# Patient Record
Sex: Female | Born: 1960 | Race: White | Hispanic: No | Marital: Married | Smoking: Former smoker
Health system: Southern US, Community
[De-identification: ages and names within clinical notes are randomized; demographics above are authoritative.]

## PROBLEM LIST (undated history)

## (undated) DIAGNOSIS — G43909 Migraine, unspecified, not intractable, without status migrainosus: Secondary | ICD-10-CM

## (undated) DIAGNOSIS — J302 Other seasonal allergic rhinitis: Secondary | ICD-10-CM

## (undated) DIAGNOSIS — Z87440 Personal history of urinary (tract) infections: Secondary | ICD-10-CM

## (undated) DIAGNOSIS — Z8619 Personal history of other infectious and parasitic diseases: Secondary | ICD-10-CM

## (undated) DIAGNOSIS — K219 Gastro-esophageal reflux disease without esophagitis: Secondary | ICD-10-CM

## (undated) DIAGNOSIS — E559 Vitamin D deficiency, unspecified: Secondary | ICD-10-CM

## (undated) DIAGNOSIS — R51 Headache: Secondary | ICD-10-CM

## (undated) DIAGNOSIS — K580 Irritable bowel syndrome with diarrhea: Secondary | ICD-10-CM

## (undated) HISTORY — DX: Headache: R51

## (undated) HISTORY — DX: Other seasonal allergic rhinitis: J30.2

## (undated) HISTORY — DX: Vitamin D deficiency, unspecified: E55.9

## (undated) HISTORY — DX: Irritable bowel syndrome with diarrhea: K58.0

## (undated) HISTORY — DX: Migraine, unspecified, not intractable, without status migrainosus: G43.909

## (undated) HISTORY — DX: Personal history of urinary (tract) infections: Z87.440

## (undated) HISTORY — DX: Personal history of other infectious and parasitic diseases: Z86.19

## (undated) HISTORY — PX: BREAST BIOPSY: SHX20

## (undated) HISTORY — PX: TUBAL LIGATION: SHX77

## (undated) HISTORY — DX: Gastro-esophageal reflux disease without esophagitis: K21.9

---

## 1981-01-01 HISTORY — PX: TONSILLECTOMY AND ADENOIDECTOMY: SUR1326

## 1996-01-02 HISTORY — PX: OTHER SURGICAL HISTORY: SHX169

## 1999-08-09 ENCOUNTER — Encounter: Admission: RE | Admit: 1999-08-09 | Discharge: 1999-08-09 | Payer: Self-pay | Admitting: Family Medicine

## 1999-08-09 ENCOUNTER — Encounter: Payer: Self-pay | Admitting: Family Medicine

## 1999-12-06 ENCOUNTER — Encounter: Payer: Self-pay | Admitting: Obstetrics and Gynecology

## 1999-12-06 ENCOUNTER — Encounter: Admission: RE | Admit: 1999-12-06 | Discharge: 1999-12-06 | Payer: Self-pay | Admitting: Obstetrics and Gynecology

## 2003-01-02 DIAGNOSIS — K219 Gastro-esophageal reflux disease without esophagitis: Secondary | ICD-10-CM

## 2003-01-02 DIAGNOSIS — K580 Irritable bowel syndrome with diarrhea: Secondary | ICD-10-CM

## 2003-01-02 HISTORY — DX: Gastro-esophageal reflux disease without esophagitis: K21.9

## 2003-01-02 HISTORY — DX: Irritable bowel syndrome with diarrhea: K58.0

## 2003-10-02 HISTORY — PX: ESOPHAGOGASTRODUODENOSCOPY: SHX1529

## 2003-11-23 ENCOUNTER — Ambulatory Visit: Payer: Self-pay | Admitting: Gastroenterology

## 2003-12-30 ENCOUNTER — Ambulatory Visit: Payer: Self-pay | Admitting: Gastroenterology

## 2004-01-02 HISTORY — PX: COLONOSCOPY: SHX174

## 2004-01-10 ENCOUNTER — Ambulatory Visit: Payer: Self-pay | Admitting: Gastroenterology

## 2009-07-29 LAB — COMPLETE METABOLIC PANEL WITH GFR
ALT: 14 U/L (ref 7–35)
CO2: 25 mmol/L
Chloride: 103 mmol/L
Creat: 0.86
Sodium: 142 mmol/L (ref 137–147)

## 2009-07-29 LAB — LIPID PANEL
Cholesterol, Total: 159
Direct LDL: 91
HDL: 60 mg/dL (ref 35–70)
Triglyceride fasting, serum: 39

## 2009-11-01 HISTORY — PX: HAMMER TOE SURGERY: SHX385

## 2010-08-16 ENCOUNTER — Ambulatory Visit (INDEPENDENT_AMBULATORY_CARE_PROVIDER_SITE_OTHER): Payer: PRIVATE HEALTH INSURANCE | Admitting: Family Medicine

## 2010-08-16 ENCOUNTER — Encounter: Payer: Self-pay | Admitting: Family Medicine

## 2010-08-16 VITALS — BP 104/78 | HR 76 | Temp 98.9°F | Ht 64.0 in | Wt 141.2 lb

## 2010-08-16 DIAGNOSIS — N390 Urinary tract infection, site not specified: Secondary | ICD-10-CM | POA: Insufficient documentation

## 2010-08-16 DIAGNOSIS — J309 Allergic rhinitis, unspecified: Secondary | ICD-10-CM

## 2010-08-16 DIAGNOSIS — R5381 Other malaise: Secondary | ICD-10-CM

## 2010-08-16 DIAGNOSIS — Z0000 Encounter for general adult medical examination without abnormal findings: Secondary | ICD-10-CM | POA: Insufficient documentation

## 2010-08-16 DIAGNOSIS — J302 Other seasonal allergic rhinitis: Secondary | ICD-10-CM | POA: Insufficient documentation

## 2010-08-16 DIAGNOSIS — R5383 Other fatigue: Secondary | ICD-10-CM

## 2010-08-16 DIAGNOSIS — Z87440 Personal history of urinary (tract) infections: Secondary | ICD-10-CM

## 2010-08-16 LAB — CBC WITH DIFFERENTIAL/PLATELET
Basophils Absolute: 0 10*3/uL (ref 0.0–0.1)
Basophils Relative: 0.7 % (ref 0.0–3.0)
Eosinophils Absolute: 0.1 10*3/uL (ref 0.0–0.7)
Eosinophils Relative: 1.9 % (ref 0.0–5.0)
HCT: 38 % (ref 36.0–46.0)
Hemoglobin: 12.7 g/dL (ref 12.0–15.0)
Lymphocytes Relative: 27.4 % (ref 12.0–46.0)
Lymphs Abs: 1.6 10*3/uL (ref 0.7–4.0)
MCHC: 33.4 g/dL (ref 30.0–36.0)
MCV: 89.1 fl (ref 78.0–100.0)
Monocytes Absolute: 0.3 10*3/uL (ref 0.1–1.0)
Monocytes Relative: 6.1 % (ref 3.0–12.0)
Neutro Abs: 3.6 10*3/uL (ref 1.4–7.7)
Neutrophils Relative %: 63.9 % (ref 43.0–77.0)
Platelets: 305 10*3/uL (ref 150.0–400.0)
RBC: 4.26 Mil/uL (ref 3.87–5.11)
RDW: 13.6 % (ref 11.5–14.6)
WBC: 5.7 10*3/uL (ref 4.5–10.5)

## 2010-08-16 LAB — LIPID PANEL
Cholesterol: 171 mg/dL (ref 0–200)
HDL: 66.6 mg/dL (ref >39.00)
LDL Cholesterol: 95 mg/dL (ref 0–99)
Total CHOL/HDL Ratio: 3
Triglycerides: 46 mg/dL (ref 0.0–149.0)
VLDL: 9.2 mg/dL (ref 0.0–40.0)

## 2010-08-16 LAB — COMPREHENSIVE METABOLIC PANEL
ALT: 24 U/L (ref 0–35)
AST: 24 U/L (ref 0–37)
Albumin: 4.4 g/dL (ref 3.5–5.2)
Alkaline Phosphatase: 58 U/L (ref 39–117)
BUN: 10 mg/dL (ref 6–23)
CO2: 28 mEq/L (ref 19–32)
Calcium: 9.3 mg/dL (ref 8.4–10.5)
Chloride: 106 mEq/L (ref 96–112)
Creatinine, Ser: 0.7 mg/dL (ref 0.4–1.2)
GFR: 93.3 mL/min (ref >60.00)
Glucose, Bld: 94 mg/dL (ref 70–99)
Potassium: 4.1 mEq/L (ref 3.5–5.1)
Sodium: 142 mEq/L (ref 135–145)
Total Bilirubin: 0.5 mg/dL (ref 0.3–1.2)
Total Protein: 7.7 g/dL (ref 6.0–8.3)

## 2010-08-16 LAB — VITAMIN B12: Vitamin B-12: 362 pg/mL (ref 211–911)

## 2010-08-16 LAB — TSH: TSH: 1.3 u[IU]/mL (ref 0.35–5.50)

## 2010-08-16 MED ORDER — DROSPIRENONE-ESTRADIOL 0.5-1 MG PO TABS: 1.0000 | ORAL_TABLET | Freq: Every day | ORAL | Status: DC

## 2010-08-16 NOTE — Progress Notes (Signed)
Subjective:    Patient ID: Kathy Pugh, female    DOB: November 22, 1960, 50 y.o.   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  Would like to establish with PCP.    Uses trimethoprim 50mg  for UTI ppx after intercourse.  Saw [place] for this in past.  End of June - ear pain/muffled, ST, nausea, cough - dx with sinusitis.  Saw Tomi Bamberger, treated with oxycodone for pain which made her nauseated and amox 1000mg  for 10 day course which caused diarrhea.  All of that better, but remaining fatigued, swollen glands in throat at end of day or when going outside to walk.  Previously liked to walk 3-4 miles daily, but since illness, too tired to walk.  Does think did have recurrence of fatigue/tired at end of July, prior to that was recuperating well.  Sleeps well.  Wakes up feeling rested.  Taking benadryl at night.  Sometimes feels flegm in throat.  No other allergy sxs such as PNDrip, RN, congestion, sneezing, itchy/watery eyes.  Preventative: Unsure last tetanus - may receive next visit. Mammogram - has been going to [place], would like to change and wants to go to breast center.  03/2010 was last one.  Had biopsy done L breast 2008, unsure. Usually receives pap smear in July, due for this.  Would like to start receiving well woman here. No recent blood work. Colonoscopy by Dr. Jarold Motto unsure when but rec rpt 10 years.  I will look at records.  Told to stay away from NSAIDs. Brings records from [employer].  Medications and allergies reviewed and updated in chart. There is no problem list on file for this patient.  Past Medical History  Diagnosis Date  . History of chicken pox   . Headache     frequent, takes tylenol  . Seasonal allergies   . Migraines     none in years  . Hx: UTI (urinary tract infection)    Past Surgical History  Procedure Date  . Tonsillectomy and adenoidectomy 1983  . Tubal ligation   . Hammer toe surgery 11/2009    left  foot - hammer toe and bunion   History  Substance Use Topics  . Smoking status: Former Smoker    Quit date: 03/02/1983  . Smokeless tobacco: Never Used  . Alcohol Use: No   Family History  Problem Relation Age of Onset  . Hyperlipidemia Mother   . Diabetes Father   . Coronary artery disease Father 21  . Hypertension Sister   . Stroke Maternal Grandfather   . Cancer Neg Hx    Allergies  Allergen Reactions  . Oxycodone Nausea Only   No current outpatient prescriptions on file prior to visit.   Review of Systems  Constitutional: Negative for fever, chills, activity change, appetite change, fatigue and unexpected weight change.  HENT: Negative for hearing loss and neck pain.   Eyes: Negative for visual disturbance.  Respiratory: Positive for cough. Negative for chest tightness, shortness of breath and wheezing.   Cardiovascular: Negative for chest pain, palpitations and leg swelling.  Gastrointestinal: Negative for nausea, vomiting, abdominal pain, diarrhea, constipation, blood in stool and abdominal distention.  Genitourinary: Negative for hematuria and difficulty urinating.  Musculoskeletal: Negative for myalgias and arthralgias.  Skin: Negative for rash.  Neurological: Negative for dizziness, seizures, syncope and headaches.  Hematological: Does not bruise/bleed easily.  Psychiatric/Behavioral: Negative for dysphoric mood. The patient is not nervous/anxious.  Objective:   Physical Exam  Nursing note and vitals reviewed. Constitutional: She is oriented to person, place, and time. She appears well-developed and well-nourished. No distress.  HENT:  Head: Normocephalic and atraumatic.  Right Ear: Hearing, tympanic membrane, external ear and ear canal normal.  Left Ear: Hearing, tympanic membrane, external ear and ear canal normal.  Nose: Nose normal. No mucosal edema or rhinorrhea.  Mouth/Throat: Uvula is midline, oropharynx is clear and moist and mucous membranes  are normal. No oropharyngeal exudate, posterior oropharyngeal edema, posterior oropharyngeal erythema or tonsillar abscesses.  Eyes: Conjunctivae and EOM are normal. Pupils are equal, round, and reactive to light.  Neck: Normal range of motion. Neck supple. No thyromegaly present.  Cardiovascular: Normal rate, regular rhythm, normal heart sounds and intact distal pulses.   No murmur heard. Pulses:      Radial pulses are 2+ on the right side, and 2+ on the left side.  Pulmonary/Chest: Effort normal and breath sounds normal. No respiratory distress. She has no wheezes. She has no rales.  Abdominal: Soft. Bowel sounds are normal. She exhibits no distension and no mass. There is no tenderness. There is no rebound and no guarding.  Musculoskeletal: Normal range of motion.  Lymphadenopathy:       Head (right side): No submental, no submandibular, no tonsillar, no preauricular, no posterior auricular and no occipital adenopathy present.       Head (left side): No submental, no submandibular, no tonsillar, no preauricular, no posterior auricular and no occipital adenopathy present.    She has cervical adenopathy.       Right cervical: Superficial cervical adenopathy present. No posterior cervical adenopathy present.      Left cervical: Superficial cervical adenopathy present. No posterior cervical adenopathy present.    She has no axillary adenopathy.       Right: No supraclavicular adenopathy present.       Left: No supraclavicular adenopathy present.  Neurological: She is alert and oriented to person, place, and time.       CN grossly intact, station and gait intact  Skin: Skin is warm and dry. No rash noted.  Psychiatric: She has a normal mood and affect. Her behavior is normal. Judgment and thought content normal.          Assessment & Plan:

## 2010-08-16 NOTE — Patient Instructions (Addendum)
Blood work today to check on things. I will review records and then call you to pick up when done. Ensure getting plenty of water throughout the day, consider starting multivitamin. Consider starting zyrtec for allergies. Try to get some walking in daily even if not feeling up to it. Return at your convenience in next 3-4 wks for physical.

## 2010-08-16 NOTE — Assessment & Plan Note (Addendum)
After sinusitis late 06/2010, treated with amoxicillin. Check basic blood work to eval fatigue. Anticipate LAD from recent sinusitis.  If remaining into next visit, consider surg referral vs abx treatment. Alternatively could be seasonal allergies given phlegm/cough, recommended trial of zyrtec or other 2nd gen antihistamine. Return for CPE/pap.

## 2010-08-20 ENCOUNTER — Encounter: Payer: Self-pay | Admitting: Family Medicine

## 2010-08-20 ENCOUNTER — Telehealth: Payer: Self-pay | Admitting: Family Medicine

## 2010-08-20 DIAGNOSIS — Z0000 Encounter for general adult medical examination without abnormal findings: Secondary | ICD-10-CM

## 2010-08-20 DIAGNOSIS — E559 Vitamin D deficiency, unspecified: Secondary | ICD-10-CM | POA: Insufficient documentation

## 2010-08-20 NOTE — Telephone Encounter (Signed)
Please notify I reviewed records and ready to pick up. Please scan and input records selected.

## 2010-08-21 ENCOUNTER — Encounter: Payer: Self-pay | Admitting: Family Medicine

## 2010-08-21 NOTE — Telephone Encounter (Signed)
Message left notifying patient that records were ready for pick up. Placed them up front. Also sent records for scanning and input results.

## 2010-08-24 ENCOUNTER — Encounter: Payer: Self-pay | Admitting: Family Medicine

## 2010-08-28 ENCOUNTER — Other Ambulatory Visit: Payer: Self-pay | Admitting: *Deleted

## 2010-08-28 MED ORDER — DROSPIRENONE-ESTRADIOL 0.5-1 MG PO TABS: 1.0000 | ORAL_TABLET | Freq: Every day | ORAL | Status: DC

## 2010-09-06 ENCOUNTER — Encounter: Payer: PRIVATE HEALTH INSURANCE | Admitting: Family Medicine

## 2010-09-22 ENCOUNTER — Encounter: Payer: PRIVATE HEALTH INSURANCE | Admitting: Family Medicine

## 2010-11-10 ENCOUNTER — Ambulatory Visit (INDEPENDENT_AMBULATORY_CARE_PROVIDER_SITE_OTHER): Payer: PRIVATE HEALTH INSURANCE | Admitting: Family Medicine

## 2010-11-10 ENCOUNTER — Encounter: Payer: Self-pay | Admitting: Family Medicine

## 2010-11-10 VITALS — BP 114/70 | HR 80 | Temp 98.7°F | Ht 63.5 in | Wt 142.8 lb

## 2010-11-10 DIAGNOSIS — Z0000 Encounter for general adult medical examination without abnormal findings: Secondary | ICD-10-CM

## 2010-11-10 DIAGNOSIS — R5381 Other malaise: Secondary | ICD-10-CM

## 2010-11-10 DIAGNOSIS — Z1211 Encounter for screening for malignant neoplasm of colon: Secondary | ICD-10-CM

## 2010-11-10 DIAGNOSIS — R5383 Other fatigue: Secondary | ICD-10-CM

## 2010-11-10 NOTE — Progress Notes (Signed)
Subjective:    Patient ID: Kathy Pugh, female    DOB: 1960/07/28, 50 y.o.   MRN: [id number]  HPI CC: CPE today  No concerns.  Zyrtec working well.  Preventative:  Unsure last tetanus - declines today. Had flu shot 10/18/2010 at work. Mammogram - has been going to [place], would like to change and wants to go to breast center.  03/2010 was last one.  Had biopsy done L breast 2008, unsure. Usually receives pap smear in July, due for this.  Last received 07/2009.  Mom with ?h/o uterine cancer, abnormal cells that led to hysterectomy.  Has had normal yearly pap smears for last 3 years, actually lifelong normal.  May space out to q2 years, rpt due fall 2013. Colonoscopy by Dr. Jarold Motto unsure when but rec rpt 10 years. I will look at records. Told to stay away from NSAIDs.  Had EGD 2005 in system, thinks done at same time.  Will start with iFOB today. Asks about bone density.  Loves milk, good dairy in diet.  Medications and allergies reviewed and updated in chart.  Past histories reviewed and updated if relevant as below. Patient Active Problem List  Diagnoses  . Seasonal allergies  . Hx: UTI (urinary tract infection)  . Healthcare maintenance  . Fatigue  . Vitamin D deficiency   Past Medical History  Diagnosis Date  . History of chicken pox   . Headache     frequent, takes tylenol  . Seasonal allergies   . Migraines     none in years  . Hx: UTI (urinary tract infection)     trimethoprim ppx  . Irritable bowel syndrome with diarrhea 2005    per GI, Dr. Jarold Motto   Past Surgical History  Procedure Date  . Tonsillectomy and adenoidectomy 1983  . Tubal ligation   . Hammer toe surgery 11/2009    left foot - hammer toe and bunion  . Laparoscopic uterine nerve ablation 1998    for endometriosis  . Esophagogastroduodenoscopy 10/2003    GERD, HH, esoph stricture, neg H pylori   History  Substance Use Topics  . Smoking status: Former Smoker    Quit date: 03/02/1983  .  Smokeless tobacco: Never Used  . Alcohol Use: No   Family History  Problem Relation Age of Onset  . Hyperlipidemia Mother   . Diabetes Father   . Coronary artery disease Father 50  . Hypertension Sister   . Stroke Maternal Grandfather   . Cancer Neg Hx     ? mother with uterine   Allergies  Allergen Reactions  . Vicodin (Hydrocodone-Acetaminophen) Nausea Only   Current Outpatient Prescriptions on File Prior to Visit  Medication Sig Dispense Refill  . drospirenone-estradiol (ANGELIQ) 0.5-1 MG per tablet Take 1 tablet by mouth daily.  30 tablet  0  . trimethoprim (TRIMPEX) 100 MG tablet Take 50 mg by mouth as directed.        . vitamin C (ASCORBIC ACID) 500 MG tablet Take 500 mg by mouth daily.         Review of Systems  Constitutional: Negative for fever, chills, activity change, appetite change, fatigue and unexpected weight change.  HENT: Negative for hearing loss and neck pain.   Eyes: Negative for visual disturbance.  Respiratory: Positive for cough. Negative for chest tightness, shortness of breath and wheezing.   Cardiovascular: Negative for chest pain, palpitations and leg swelling.  Gastrointestinal: Negative for nausea, vomiting, abdominal pain, diarrhea, constipation, blood in stool and  abdominal distention.  Genitourinary: Negative for hematuria and difficulty urinating.  Musculoskeletal: Negative for myalgias and arthralgias.  Skin: Negative for rash.  Neurological: Negative for dizziness, seizures, syncope and headaches.  Hematological: Does not bruise/bleed easily.  Psychiatric/Behavioral: Negative for dysphoric mood. The patient is not nervous/anxious.        Objective:   Physical Exam  Nursing note and vitals reviewed. Constitutional: She is oriented to person, place, and time. She appears well-developed and well-nourished. No distress.  HENT:  Head: Normocephalic and atraumatic.  Right Ear: Hearing, tympanic membrane, external ear and ear canal normal.    Left Ear: Hearing, tympanic membrane, external ear and ear canal normal.  Nose: Nose normal. No mucosal edema or rhinorrhea.  Mouth/Throat: Uvula is midline, oropharynx is clear and moist and mucous membranes are normal. No oropharyngeal exudate, posterior oropharyngeal edema, posterior oropharyngeal erythema or tonsillar abscesses.  Eyes: Conjunctivae and EOM are normal. Pupils are equal, round, and reactive to light. No scleral icterus.  Neck: Normal range of motion. Neck supple. No thyromegaly present.  Cardiovascular: Normal rate, regular rhythm, normal heart sounds and intact distal pulses.   No murmur heard. Pulses:      Radial pulses are 2+ on the right side, and 2+ on the left side.  Pulmonary/Chest: Effort normal and breath sounds normal. No respiratory distress. She has no wheezes. She has no rales. Right breast exhibits no inverted nipple, no mass, no nipple discharge, no skin change and no tenderness. Left breast exhibits no inverted nipple, no mass, no nipple discharge, no skin change and no tenderness. Breasts are symmetrical.  Abdominal: Soft. Bowel sounds are normal. She exhibits no distension and no mass. There is no tenderness. There is no rebound and no guarding.  Musculoskeletal: Normal range of motion.  Lymphadenopathy:    She has no cervical adenopathy.  Neurological: She is alert and oriented to person, place, and time.       CN grossly intact, station and gait intact  Skin: Skin is warm and dry. No rash noted.  Psychiatric: She has a normal mood and affect. Her behavior is normal. Judgment and thought content normal.      Assessment & Plan:

## 2010-11-10 NOTE — Assessment & Plan Note (Signed)
Improved

## 2010-11-10 NOTE — Assessment & Plan Note (Addendum)
Reviewed preventative protocols, updated unless pt declined. Declines tetanus. Flu UTD this year. Breast exam today.  Due for mammo 03/2010.  Would like at breast center. Deferred pap today given all normal hx in past.  Will space out to Q2 years given ?fm hx, rpt 2013, screen q2 yrs. Unable to find records of colonoscopy but pt states had done 2005 and told normal, rpt due 10 yrs - will start with iFOB and try to obtain copy of colonoscopy.

## 2010-11-10 NOTE — Patient Instructions (Signed)
Physical today - all normal. Mammogram due 03/2010.  Let us know if you need referral to breast center. Think about tetanus shot.  Try to get most or all of your calcium from your food--aim for 1200 mg/day for women over 50 and men over 70.  To figure out dietary calcium: 300 mg/day from all non dairy foods plus 300 mg per cup of milk, other dairy, or fortified juice. Non dairy foods that contain calcium: Kale, oranges, sardines, oatmeal, soy milk/soybeans, salmon, white beans, dried figs, turnip greens, almonds, broccoli, tofu.  Vitamin D goal is 800 IU daily.  Dietary sources: fish - mackerel, tuna, salmon, herring, sardines, catfish, cod liver oil, and eggs.  And small doses of sunshine throughout the day.  Return in 1 year for next physical, sooner if needed.

## 2010-11-21 ENCOUNTER — Other Ambulatory Visit: Payer: Self-pay | Admitting: Dermatology

## 2010-11-24 ENCOUNTER — Other Ambulatory Visit: Payer: Self-pay | Admitting: Family Medicine

## 2010-11-24 ENCOUNTER — Other Ambulatory Visit: Payer: PRIVATE HEALTH INSURANCE

## 2010-11-24 DIAGNOSIS — Z1211 Encounter for screening for malignant neoplasm of colon: Secondary | ICD-10-CM

## 2010-11-27 ENCOUNTER — Encounter: Payer: Self-pay | Admitting: *Deleted

## 2011-01-17 ENCOUNTER — Other Ambulatory Visit: Payer: Self-pay | Admitting: Internal Medicine

## 2011-01-17 MED ORDER — DROSPIRENONE-ESTRADIOL 0.5-1 MG PO TABS: 1.0000 | ORAL_TABLET | Freq: Every day | ORAL | Status: DC

## 2011-01-17 NOTE — Telephone Encounter (Signed)
Rx faxed to pharmacy  

## 2011-02-19 ENCOUNTER — Other Ambulatory Visit: Payer: Self-pay | Admitting: Family Medicine

## 2011-02-19 DIAGNOSIS — Z1231 Encounter for screening mammogram for malignant neoplasm of breast: Secondary | ICD-10-CM

## 2011-03-15 ENCOUNTER — Ambulatory Visit
Admission: RE | Admit: 2011-03-15 | Discharge: 2011-03-15 | Disposition: A | Discharge status: Home / Self Care | Payer: Commercial Managed Care - PPO | Source: Ambulatory Visit | Attending: Family Medicine | Admitting: Family Medicine

## 2011-03-15 DIAGNOSIS — Z1231 Encounter for screening mammogram for malignant neoplasm of breast: Secondary | ICD-10-CM

## 2011-03-23 ENCOUNTER — Encounter: Payer: Self-pay | Admitting: *Deleted

## 2011-03-28 ENCOUNTER — Ambulatory Visit (INDEPENDENT_AMBULATORY_CARE_PROVIDER_SITE_OTHER): Payer: Commercial Managed Care - PPO | Admitting: Family Medicine

## 2011-03-28 ENCOUNTER — Encounter: Payer: Self-pay | Admitting: Family Medicine

## 2011-03-28 VITALS — BP 110/60 | HR 88 | Temp 99.9°F | Wt 142.0 lb

## 2011-03-28 DIAGNOSIS — J069 Acute upper respiratory infection, unspecified: Secondary | ICD-10-CM | POA: Insufficient documentation

## 2011-03-28 MED ORDER — HYDROCOD POLST-CHLORPHEN POLST 10-8 MG/5ML PO LQCR: 5.0000 mL | Freq: Every evening | ORAL | Status: DC | PRN

## 2011-03-28 MED ORDER — AMOXICILLIN-POT CLAVULANATE 875-125 MG PO TABS: 1.0000 | ORAL_TABLET | Freq: Two times a day (BID) | ORAL | Status: AC

## 2011-03-28 NOTE — Patient Instructions (Signed)
Take Augmentin as directed- 1 tab twice daily for 10 days.  Drink lots of fluids.  Treat sympotmatically with Zyrtec,  Mucinex, nasal saline irrigation, and Tylenol/Ibuprofen. Cough suppressant at night. Call if not improving as expected in 5-7 days.

## 2011-03-28 NOTE — Progress Notes (Signed)
SUBJECTIVE:  Kathy Pugh is a 51 y.o. female who complains of coryza, congestion, sneezing, sore throat, dry cough, myalgias, headache and bilateral sinus pain for 10 days. She denies a history of anorexia, chest pain, fevers, shortness of breath, sweats, vomiting and weakness and denies a history of asthma. Patient denies smoke cigarettes.   Patient Active Problem List  Diagnoses  . Seasonal allergies  . Hx: UTI (urinary tract infection)  . Healthcare maintenance  . Fatigue  . Vitamin D deficiency  . URI (upper respiratory infection)   Past Medical History  Diagnosis Date  . History of chicken pox   . Headache     frequent, takes tylenol  . Seasonal allergies   . Migraines     none in years  . Hx: UTI (urinary tract infection)     trimethoprim ppx  . Irritable bowel syndrome with diarrhea 2005    per GI, Dr. Jarold Motto   Past Surgical History  Procedure Date  . Tonsillectomy and adenoidectomy 1983  . Tubal ligation   . Hammer toe surgery 11/2009    left foot - hammer toe and bunion  . Laparoscopic uterine nerve ablation 1998    for endometriosis  . Esophagogastroduodenoscopy 10/2003    GERD, HH, esoph stricture, neg H pylori   History  Substance Use Topics  . Smoking status: Former Smoker    Quit date: 03/02/1983  . Smokeless tobacco: Never Used  . Alcohol Use: No   Family History  Problem Relation Age of Onset  . Hyperlipidemia Mother   . Diabetes Father   . Coronary artery disease Father 1  . Hypertension Sister   . Stroke Maternal Grandfather   . Cancer Neg Hx     ? mother with uterine   Allergies  Allergen Reactions  . Vicodin (Hydrocodone-Acetaminophen) Nausea Only   Current Outpatient Prescriptions on File Prior to Visit  Medication Sig Dispense Refill  . cetirizine (ZYRTEC) 10 MG tablet Take 10 mg by mouth daily.        . drospirenone-estradiol (ANGELIQ) 0.5-1 MG per tablet Take 1 tablet by mouth daily.  30 tablet  3  . trimethoprim (TRIMPEX)  100 MG tablet Take 50 mg by mouth as directed.        . vitamin C (ASCORBIC ACID) 500 MG tablet Take 500 mg by mouth daily.         The PMH, PSH, Social History, Family History, Medications, and allergies have been reviewed in [facility], and have been updated if relevant.  OBJECTIVE: She appears well, vital signs are as noted. Ears normal.  Throat and pharynx normal.  Neck supple. No adenopathy in the neck. Nose is congested. Sinuses  Tender to palp throughout. The chest is clear, without wheezes or rales.  ASSESSMENT:  Sinusitis   PLAN: Given duration and progression of symptoms, will treat for bacterial sinusitis with Augmentin. Symptomatic therapy suggested: push fluids, rest and return office visit prn if symptoms persist or worsen.  Call or return to clinic prn if these symptoms worsen or fail to improve as anticipated.

## 2011-05-06 ENCOUNTER — Other Ambulatory Visit: Payer: Self-pay | Admitting: Family Medicine

## 2011-06-10 ENCOUNTER — Other Ambulatory Visit: Payer: Self-pay | Admitting: Family Medicine

## 2011-11-07 ENCOUNTER — Other Ambulatory Visit: Payer: Self-pay | Admitting: Family Medicine

## 2011-11-07 DIAGNOSIS — E559 Vitamin D deficiency, unspecified: Secondary | ICD-10-CM

## 2011-11-07 DIAGNOSIS — Z0000 Encounter for general adult medical examination without abnormal findings: Secondary | ICD-10-CM

## 2011-11-09 ENCOUNTER — Other Ambulatory Visit (INDEPENDENT_AMBULATORY_CARE_PROVIDER_SITE_OTHER): Payer: Commercial Managed Care - PPO

## 2011-11-09 DIAGNOSIS — Z0000 Encounter for general adult medical examination without abnormal findings: Secondary | ICD-10-CM

## 2011-11-09 DIAGNOSIS — E559 Vitamin D deficiency, unspecified: Secondary | ICD-10-CM

## 2011-11-09 LAB — BASIC METABOLIC PANEL
CO2: 27 mEq/L (ref 19–32)
Chloride: 108 mEq/L (ref 96–112)
Potassium: 4 mEq/L (ref 3.5–5.1)
Sodium: 141 mEq/L (ref 135–145)

## 2011-11-22 ENCOUNTER — Other Ambulatory Visit: Payer: Self-pay | Admitting: Family Medicine

## 2011-11-26 ENCOUNTER — Other Ambulatory Visit (HOSPITAL_COMMUNITY)
Admission: RE | Admit: 2011-11-26 | Discharge: 2011-11-26 | Disposition: A | Discharge status: Home / Self Care | Payer: Commercial Managed Care - PPO | Source: Ambulatory Visit | Attending: Family Medicine | Admitting: Family Medicine

## 2011-11-26 ENCOUNTER — Encounter: Payer: Self-pay | Admitting: Family Medicine

## 2011-11-26 ENCOUNTER — Ambulatory Visit (INDEPENDENT_AMBULATORY_CARE_PROVIDER_SITE_OTHER): Payer: Commercial Managed Care - PPO | Admitting: Family Medicine

## 2011-11-26 VITALS — BP 104/64 | HR 72 | Temp 98.5°F | Ht 63.5 in | Wt 144.2 lb

## 2011-11-26 DIAGNOSIS — Z1151 Encounter for screening for human papillomavirus (HPV): Secondary | ICD-10-CM | POA: Insufficient documentation

## 2011-11-26 DIAGNOSIS — Z0000 Encounter for general adult medical examination without abnormal findings: Secondary | ICD-10-CM

## 2011-11-26 DIAGNOSIS — Z124 Encounter for screening for malignant neoplasm of cervix: Secondary | ICD-10-CM

## 2011-11-26 DIAGNOSIS — Z01419 Encounter for gynecological examination (general) (routine) without abnormal findings: Secondary | ICD-10-CM | POA: Insufficient documentation

## 2011-11-26 DIAGNOSIS — E559 Vitamin D deficiency, unspecified: Secondary | ICD-10-CM

## 2011-11-26 NOTE — Patient Instructions (Addendum)
Breast exam today and pelvic exam today. Good to see you, call us with questions. Start vitamin D 1000 international units daily to help raise levels. We will continue Angeliq for now. We will try to get records of colonoscopy. Have a happy thanksgiving. Tetanus shot?

## 2011-11-26 NOTE — Assessment & Plan Note (Signed)
Preventative protocols reviewed and updated unless pt declined. Discussed healthy diet and lifestyle. Pap/breast exam performed today. Reviewed blood work in detail.

## 2011-11-26 NOTE — Assessment & Plan Note (Signed)
Discussed dietary vit D sources and recommended starting 1000 IU dialy.

## 2011-11-26 NOTE — Progress Notes (Signed)
Subjective:    Patient ID: Kathy Pugh, female    DOB: [birth year], 51 y.o.   MRN: [id number]  HPI CC: CPE  HRT - Angeliq started around menopause at age 67 yo.  Started for vag dryness as well as hot flashes and significantly helped.  Taking shagging lessons with husband.  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  Good fruits and vegetables, lots of water.  Yogurt for breakfast, 1 cup milk/day.  Sunscreen use discussed Seat belt use discussed  Preventative:  Unsure last tetanus - declines today.  Not around children younger than one year of age. UTD flu shot. Mammogram - birads 1 03/2011.  Gets regularly at breast center.  Had biopsy done L breast 2008, unsure.  Well woman exam - pap smear due this year.  Last received 07/2009, always normal. Mom with ?h/o uterine cancer, abnormal cells that led to hysterectomy. given fm hx will space out to q2 years, rpt due this year.   Colonoscopy by Dr. Jarold Motto.  Had EGD 2005 in system, thinks done at same time. No records of this.  Requests iFOB  Medications and allergies reviewed and updated in chart.  Past histories reviewed and updated if relevant as below. Patient Active Problem List  Diagnosis  . Seasonal allergies  . Hx: UTI (urinary tract infection)  . Healthcare maintenance  . Fatigue  . Vitamin D deficiency  . URI (upper respiratory infection)   Past Medical History  Diagnosis Date  . History of chicken pox   . Headache     frequent, takes tylenol  . Seasonal allergies   . Migraines     none in years  . Hx: UTI (urinary tract infection)     trimethoprim ppx  . Irritable bowel syndrome with diarrhea 2005    per GI, Dr. Jarold Motto  . GERD (gastroesophageal reflux disease) 2005    with esophageal stricture and HH per EGD   Past Surgical History  Procedure Date  .  Tonsillectomy and adenoidectomy 1983  . Tubal ligation   . Hammer toe surgery 11/2009    left foot - hammer toe and bunion  . Laparoscopic uterine nerve ablation 1998    for endometriosis  . Esophagogastroduodenoscopy 10/2003    GERD, HH, esoph stricture, neg H pylori   History  Substance Use Topics  . Smoking status: Former Smoker    Quit date: 03/02/1983  . Smokeless tobacco: Never Used  . Alcohol Use: No   Family History  Problem Relation Age of Onset  . Hyperlipidemia Mother   . Diabetes Father   . Coronary artery disease Father 79  . Hypertension Sister   . Stroke Maternal Grandfather   . Cancer Neg Hx     ? mother with uterine   Allergies  Allergen Reactions  . Vicodin (Hydrocodone-Acetaminophen) Nausea Only   Current Outpatient Prescriptions on File Prior to Visit  Medication Sig Dispense Refill  . cetirizine (ZYRTEC) 10 MG tablet Take 10 mg by mouth daily.        Marland Kitchen trimethoprim (TRIMPEX) 100 MG tablet Take 50 mg by mouth as directed. For UTI ppx      . vitamin C (ASCORBIC ACID) 500 MG tablet Take 500 mg by mouth daily.        . [DISCONTINUED] ANGELIQ 0.5-1 MG per tablet TAKE 1 TABLET BY MOUTH DAILY.  28 tablet  6     Review of Systems  Constitutional: Negative for fever, chills, activity change, appetite change, fatigue and unexpected weight change.  HENT: Negative for hearing loss and neck pain.   Eyes: Negative for visual disturbance.  Respiratory: Negative for cough, chest tightness, shortness of breath and wheezing.   Cardiovascular: Negative for chest pain, palpitations and leg swelling.  Gastrointestinal: Negative for nausea, vomiting, abdominal pain, diarrhea, constipation, blood in stool and abdominal distention.  Genitourinary: Negative for hematuria and difficulty urinating.  Musculoskeletal: Negative for myalgias and arthralgias.  Skin: Negative for rash.  Neurological: Negative for dizziness, seizures, syncope and headaches.  Hematological: Does  not bruise/bleed easily.  Psychiatric/Behavioral: Negative for dysphoric mood. The patient is not nervous/anxious.        Objective:   Physical Exam  Nursing note and vitals reviewed. Constitutional: She is oriented to person, place, and time. She appears well-developed and well-nourished. No distress.  HENT:  Head: Normocephalic and atraumatic.  Right Ear: Hearing, tympanic membrane, external ear and ear canal normal.  Left Ear: Hearing, tympanic membrane, external ear and ear canal normal.  Nose: Nose normal.  Mouth/Throat: Oropharynx is clear and moist. No oropharyngeal exudate.  Eyes: Conjunctivae normal and EOM are normal. Pupils are equal, round, and reactive to light. No scleral icterus.  Neck: Normal range of motion. Neck supple.  Cardiovascular: Normal rate, regular rhythm, normal heart sounds and intact distal pulses.   No murmur heard. Pulses:      Radial pulses are 2+ on the right side, and 2+ on the left side.  Pulmonary/Chest: Effort normal and breath sounds normal. No respiratory distress. She has no wheezes. She has no rales. Right breast exhibits no inverted nipple, no mass, no nipple discharge, no skin change and no tenderness. Left breast exhibits no inverted nipple, no mass, no nipple discharge, no skin change and no tenderness. Breasts are symmetrical.  Abdominal: Soft. Bowel sounds are normal. She exhibits no distension and no mass. There is no tenderness. There is no rebound and no guarding. Hernia confirmed negative in the right inguinal area and confirmed negative in the left inguinal area.  Genitourinary: Vagina normal. Pelvic exam was performed with patient supine. There is no rash, tenderness, lesion or injury on the right labia. There is no rash, tenderness, lesion or injury on the left labia. No erythema, tenderness or bleeding around the vagina. No foreign body around the vagina. No signs of injury around the vagina. No vaginal discharge found.       Pap  performed  Musculoskeletal: Normal range of motion. She exhibits no edema.  Lymphadenopathy:    She has no cervical adenopathy.       Right cervical: No deep cervical adenopathy present.      Left cervical: No deep cervical adenopathy present.    She has no axillary adenopathy.       Right axillary: No lateral adenopathy present.       Left axillary: No lateral adenopathy present.      Right: No inguinal adenopathy present.       Left: No inguinal adenopathy present.  Neurological: She is alert and oriented to person, place, and time.       CN grossly intact, station and gait intact  Skin: Skin is warm and dry. No rash noted.  Psychiatric: She has a normal mood and affect. Her behavior is normal. Judgment and thought content normal.       Assessment & Plan:

## 2011-11-26 NOTE — Addendum Note (Signed)
Addended by: Sydell Axon C on: 11/26/2011 04:46 PM   Modules accepted: Orders

## 2011-11-28 ENCOUNTER — Encounter: Payer: Self-pay | Admitting: Family Medicine

## 2011-12-06 ENCOUNTER — Encounter: Payer: Self-pay | Admitting: Family Medicine

## 2012-02-05 ENCOUNTER — Other Ambulatory Visit: Payer: Self-pay | Admitting: Family Medicine

## 2012-02-05 DIAGNOSIS — Z1231 Encounter for screening mammogram for malignant neoplasm of breast: Secondary | ICD-10-CM

## 2012-03-17 ENCOUNTER — Ambulatory Visit
Admission: RE | Admit: 2012-03-17 | Discharge: 2012-03-17 | Disposition: A | Discharge status: Home / Self Care | Payer: Commercial Managed Care - PPO | Source: Ambulatory Visit | Attending: Family Medicine | Admitting: Family Medicine

## 2012-03-17 DIAGNOSIS — Z1231 Encounter for screening mammogram for malignant neoplasm of breast: Secondary | ICD-10-CM

## 2012-03-18 ENCOUNTER — Encounter: Payer: Self-pay | Admitting: *Deleted

## 2012-06-04 ENCOUNTER — Other Ambulatory Visit: Payer: Self-pay | Admitting: Family Medicine

## 2012-11-11 ENCOUNTER — Other Ambulatory Visit: Payer: Self-pay | Admitting: Family Medicine

## 2012-11-11 DIAGNOSIS — E559 Vitamin D deficiency, unspecified: Secondary | ICD-10-CM

## 2012-11-11 DIAGNOSIS — Z0000 Encounter for general adult medical examination without abnormal findings: Secondary | ICD-10-CM

## 2012-11-14 ENCOUNTER — Other Ambulatory Visit (INDEPENDENT_AMBULATORY_CARE_PROVIDER_SITE_OTHER): Payer: Commercial Managed Care - PPO

## 2012-11-14 DIAGNOSIS — E559 Vitamin D deficiency, unspecified: Secondary | ICD-10-CM

## 2012-11-14 DIAGNOSIS — Z0000 Encounter for general adult medical examination without abnormal findings: Secondary | ICD-10-CM

## 2012-11-14 LAB — BASIC METABOLIC PANEL
CO2: 27 mEq/L (ref 19–32)
Chloride: 103 mEq/L (ref 96–112)
Sodium: 137 mEq/L (ref 135–145)

## 2012-11-15 LAB — VITAMIN D 25 HYDROXY (VIT D DEFICIENCY, FRACTURES): Vit D, 25-Hydroxy: 53 ng/mL (ref 30–89)

## 2012-11-26 ENCOUNTER — Ambulatory Visit (INDEPENDENT_AMBULATORY_CARE_PROVIDER_SITE_OTHER): Payer: Commercial Managed Care - PPO | Admitting: Family Medicine

## 2012-11-26 ENCOUNTER — Encounter: Payer: Self-pay | Admitting: Family Medicine

## 2012-11-26 VITALS — BP 118/70 | HR 84 | Temp 98.5°F | Ht 63.5 in | Wt 145.8 lb

## 2012-11-26 DIAGNOSIS — E559 Vitamin D deficiency, unspecified: Secondary | ICD-10-CM

## 2012-11-26 DIAGNOSIS — Z1211 Encounter for screening for malignant neoplasm of colon: Secondary | ICD-10-CM

## 2012-11-26 DIAGNOSIS — Z23 Encounter for immunization: Secondary | ICD-10-CM

## 2012-11-26 DIAGNOSIS — Z0000 Encounter for general adult medical examination without abnormal findings: Secondary | ICD-10-CM

## 2012-11-26 NOTE — Patient Instructions (Signed)
Tdap today. Pass by lab to pick up stool kit. Return on Friday for fasting blood work (to check cholesterol) Good to see you today, call us with quesitons.

## 2012-11-26 NOTE — Progress Notes (Signed)
Pre-visit discussion using our clinic review tool. No additional management support is needed unless otherwise documented below in the visit note.  

## 2012-11-26 NOTE — Addendum Note (Signed)
Addended by: Josph Macho A on: 11/26/2012 09:11 AM   Modules accepted: Orders

## 2012-11-26 NOTE — Progress Notes (Signed)
Subjective:    Patient ID: Kathy Pugh, female    DOB: 24-Oct-1960, 52 y.o.   MRN: [id number]  HPI CC: CPE  Sunscreen use discussed. No sunburns in last year.  No suspicious moles on skin. Seat belt use discussed   Preventative:  Mammogram - birads 2 03/2012. Gets regularly at breast center. H/o L breast biopsy 2008, unsure result.  Well woman exam - pap smear normal last year.  Always normal.  Mom with ?h/o uterine cancer, abnormal cells that led to hysterectomy. given fm hx will space out q2 yrs. Colonoscopy by Dr. Jarold Motto. Had EGD 2005 in system, thinks done at same time. No records of this. iFOB never returned last year. Unsure last tetanus "I don't think i've ever had tetanus shot" -  UTD flu shot.   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 Good fruits and vegetables, lots of water. Yogurt for breakfast, 1 cup milk/day.  Medications and allergies reviewed and updated in chart.  Past histories reviewed and updated if relevant as below. Patient Active Problem List   Diagnosis Date Noted  . Vitamin D deficiency 08/20/2010  . Healthcare maintenance 08/16/2010  . Fatigue 08/16/2010  . Seasonal allergies   . Hx: UTI (urinary tract infection)    Past Medical History  Diagnosis Date  . History of chicken pox   . Headache(784.0)     frequent, takes tylenol  . Seasonal allergies   . Migraines     none in years  . Hx: UTI (urinary tract infection)     trimethoprim ppx  . Irritable bowel syndrome with diarrhea 2005    per GI, Dr. Jarold Motto  . GERD (gastroesophageal reflux disease) 2005    with esophageal stricture and HH per EGD  . Vitamin D deficiency    Past Surgical History  Procedure Laterality Date  . Tonsillectomy and adenoidectomy  1983  . Tubal ligation    . Hammer toe surgery  11/2009    left foot -  hammer toe and bunion  . Laparoscopic uterine nerve ablation  1998    for endometriosis  . Esophagogastroduodenoscopy  10/2003    GERD, HH, esoph stricture, neg H pylori  . Colonoscopy  01/2004    normal Jarold Motto)   History  Substance Use Topics  . Smoking status: Former Smoker    Quit date: 03/02/1983  . Smokeless tobacco: Never Used  . Alcohol Use: No   Family History  Problem Relation Age of Onset  . Hyperlipidemia Mother   . Diabetes Father   . Coronary artery disease Father 71  . Hypertension Sister   . Stroke Maternal Grandfather   . Cancer Neg Hx     ? mother with uterine   Allergies  Allergen Reactions  . Vicodin [Hydrocodone-Acetaminophen] Nausea Only   Current Outpatient Prescriptions on File Prior to Visit  Medication Sig Dispense Refill  . ANGELIQ 0.5-1 MG per tablet TAKE 1 TABLET BY MOUTH DAILY.  28 tablet  6  . cetirizine (ZYRTEC) 10 MG tablet Take 10 mg by mouth daily.        . cholecalciferol (VITAMIN D) 1000 UNITS tablet Take 1,000 Units by mouth daily.      . vitamin C (ASCORBIC ACID) 500 MG tablet Take 500 mg by mouth daily.         No current facility-administered  medications on file prior to visit.     Review of Systems  Constitutional: Negative for fever, chills, activity change, appetite change, fatigue and unexpected weight change.  HENT: Negative for hearing loss.   Eyes: Negative for visual disturbance.  Respiratory: Negative for cough, chest tightness, shortness of breath and wheezing.   Cardiovascular: Negative for chest pain, palpitations and leg swelling.  Gastrointestinal: Negative for nausea, vomiting, abdominal pain, diarrhea, constipation, blood in stool and abdominal distention.  Genitourinary: Negative for hematuria and difficulty urinating.  Musculoskeletal: Negative for arthralgias, myalgias and neck pain.  Skin: Negative for rash.  Neurological: Negative for dizziness, seizures, syncope and headaches.  Hematological: Negative  for adenopathy. Does not bruise/bleed easily.  Psychiatric/Behavioral: Negative for dysphoric mood. The patient is not nervous/anxious.        Objective:   Physical Exam  Nursing note and vitals reviewed. Constitutional: She is oriented to person, place, and time. She appears well-developed and well-nourished. No distress.  HENT:  Head: Normocephalic and atraumatic.  Right Ear: Hearing, tympanic membrane, external ear and ear canal normal.  Left Ear: Hearing, tympanic membrane, external ear and ear canal normal.  Nose: Nose normal.  Mouth/Throat: Oropharynx is clear and moist. No oropharyngeal exudate.  Eyes: Conjunctivae and EOM are normal. Pupils are equal, round, and reactive to light. No scleral icterus.  Neck: Normal range of motion. Neck supple. No thyromegaly present.  Cardiovascular: Normal rate, regular rhythm, normal heart sounds and intact distal pulses.   No murmur heard. Pulses:      Radial pulses are 2+ on the right side, and 2+ on the left side.  Pulmonary/Chest: Effort normal and breath sounds normal. No respiratory distress. She has no wheezes. She has no rales. Right breast exhibits no inverted nipple, no mass, no nipple discharge, no skin change and no tenderness. Left breast exhibits no inverted nipple, no mass, no nipple discharge, no skin change and no tenderness.  Abdominal: Soft. Bowel sounds are normal. She exhibits no distension and no mass. There is no tenderness. There is no rebound and no guarding.  Musculoskeletal: Normal range of motion. She exhibits no edema.  Lymphadenopathy:       Head (right side): No submental, no submandibular, no tonsillar, no preauricular and no posterior auricular adenopathy present.       Head (left side): No submental, no submandibular, no tonsillar, no preauricular and no posterior auricular adenopathy present.    She has no cervical adenopathy.    She has no axillary adenopathy.       Right axillary: No lateral adenopathy  present.       Left axillary: No lateral adenopathy present.      Right: No supraclavicular adenopathy present.       Left: No supraclavicular adenopathy present.  Neurological: She is alert and oriented to person, place, and time.  CN grossly intact, station and gait intact  Skin: Skin is warm and dry. No rash noted.  Psychiatric: She has a normal mood and affect. Her behavior is normal. Judgment and thought content normal.       Assessment & Plan:

## 2012-11-26 NOTE — Assessment & Plan Note (Signed)
Vit D normal levels this year.  rec continued supplementation.

## 2012-11-26 NOTE — Assessment & Plan Note (Signed)
Preventative protocols reviewed and updated unless pt declined. Discussed healthy diet and lifestyle.  Pap Q2 yrs per pt preference.  Breast exam today.  mammo scheduled early next year.

## 2012-11-28 ENCOUNTER — Other Ambulatory Visit (INDEPENDENT_AMBULATORY_CARE_PROVIDER_SITE_OTHER): Payer: Commercial Managed Care - PPO

## 2012-11-28 DIAGNOSIS — Z0000 Encounter for general adult medical examination without abnormal findings: Secondary | ICD-10-CM

## 2012-11-28 LAB — LIPID PANEL
HDL: 51.2 mg/dL (ref >39.00)
LDL Cholesterol: 98 mg/dL (ref 0–99)
Total CHOL/HDL Ratio: 3
Triglycerides: 60 mg/dL (ref 0.0–149.0)

## 2012-12-01 ENCOUNTER — Telehealth: Payer: Self-pay | Admitting: Family Medicine

## 2012-12-01 ENCOUNTER — Encounter: Payer: Self-pay | Admitting: *Deleted

## 2012-12-01 NOTE — Telephone Encounter (Signed)
Message left for patient to return my call.  

## 2012-12-01 NOTE — Telephone Encounter (Signed)
Pt wants a call with the numbers for her labs she had drawn last week.   [phone] work until 4:30pm [phone] after 4:30pm

## 2012-12-02 NOTE — Telephone Encounter (Signed)
Spoke with patient.

## 2012-12-03 ENCOUNTER — Other Ambulatory Visit: Payer: Commercial Managed Care - PPO

## 2012-12-03 DIAGNOSIS — Z1211 Encounter for screening for malignant neoplasm of colon: Secondary | ICD-10-CM

## 2012-12-04 ENCOUNTER — Encounter: Payer: Self-pay | Admitting: *Deleted

## 2012-12-23 ENCOUNTER — Other Ambulatory Visit: Payer: Self-pay | Admitting: Family Medicine

## 2013-02-10 ENCOUNTER — Other Ambulatory Visit: Payer: Self-pay

## 2013-02-10 DIAGNOSIS — Z1231 Encounter for screening mammogram for malignant neoplasm of breast: Secondary | ICD-10-CM

## 2013-02-12 ENCOUNTER — Ambulatory Visit (INDEPENDENT_AMBULATORY_CARE_PROVIDER_SITE_OTHER)
Admission: RE | Admit: 2013-02-12 | Discharge: 2013-02-12 | Disposition: A | Discharge status: Home / Self Care | Payer: Commercial Managed Care - PPO | Source: Ambulatory Visit | Attending: Family Medicine | Admitting: Family Medicine

## 2013-02-12 ENCOUNTER — Ambulatory Visit (INDEPENDENT_AMBULATORY_CARE_PROVIDER_SITE_OTHER): Payer: Commercial Managed Care - PPO | Admitting: Family Medicine

## 2013-02-12 ENCOUNTER — Encounter: Payer: Self-pay | Admitting: Family Medicine

## 2013-02-12 VITALS — BP 112/76 | HR 84 | Temp 98.3°F | Wt 147.2 lb

## 2013-02-12 DIAGNOSIS — R059 Cough, unspecified: Secondary | ICD-10-CM

## 2013-02-12 DIAGNOSIS — J069 Acute upper respiratory infection, unspecified: Secondary | ICD-10-CM | POA: Insufficient documentation

## 2013-02-12 DIAGNOSIS — R05 Cough: Secondary | ICD-10-CM

## 2013-02-12 LAB — POCT INFLUENZA A/B
INFLUENZA A, POC: NEGATIVE
Influenza B, POC: NEGATIVE

## 2013-02-12 MED ORDER — GUAIFENESIN-CODEINE 100-10 MG/5ML PO SYRP: 5.0000 mL | ORAL_SOLUTION | Freq: Two times a day (BID) | ORAL | Status: DC | PRN

## 2013-02-12 MED ORDER — AZITHROMYCIN 250 MG PO TABS: ORAL_TABLET | ORAL | Status: DC

## 2013-02-12 NOTE — Progress Notes (Signed)
BP 112/76  Pulse 84  Temp(Src) 98.3 F (36.8 C) (Oral)  Wt 147 lb 4 oz (66.792 kg)  SpO2 98%   CC: "i've got the crud" Subjective:    Patient ID: Kathy Pugh, female    DOB: 05/25/1960, 53 y.o.   MRN: [id number]  HPI: Kathy Pugh is a 53 y.o. female presenting on 02/12/2013 with Cough  3 wk h/o congestion ,trouble sleeping from dry cough, rhinorrhea and ear pain.  Low grade fevers.  + PNDrainage.  Upset stomach with nausea and diarrhea.  HA.  + chest congestion and pressure worse with coughing.  No pleuritic pain.  + fatigue, body aches.  Significant deterioration of sxs over last 4 days.  Out of work today 2/2 feeling ill.  Tried advil, benadryl, robitussin DM, pseudophed.  No vomiting, tooth pain, wheezing or dyspnea, rash.  Never fevers. + sick contacts at work. Flu shot received this year. No smokers at home. No cat scratches recently.  No recent travel. She has had mono as teenager in the past.  Relevant past medical, surgical, family and social history reviewed and updated. Allergies and medications reviewed and updated. Current Outpatient Prescriptions on File Prior to Visit  Medication Sig  . ANGELIQ 0.5-1 MG per tablet TAKE 1 TABLET EVERY DAY  . cetirizine (ZYRTEC) 10 MG tablet Take 10 mg by mouth daily.    . cholecalciferol (VITAMIN D) 1000 UNITS tablet Take 1,000 Units by mouth daily.  . Ginkgo Biloba 40 MG TABS Take 1 tablet by mouth daily.  . vitamin C (ASCORBIC ACID) 500 MG tablet Take 500 mg by mouth daily.     No current facility-administered medications on file prior to visit.    Review of Systems Per HPI unless specifically indicated above    Objective:    BP 112/76  Pulse 84  Temp(Src) 98.3 F (36.8 C) (Oral)  Wt 147 lb 4 oz (66.792 kg)  SpO2 98%  Physical Exam  Nursing note and vitals reviewed. Constitutional: She appears well-developed and well-nourished. No distress.  Fatigued, ill appearing  HENT:  Head: Normocephalic and atraumatic.    Right Ear: Hearing, tympanic membrane, external ear and ear canal normal.  Left Ear: Hearing, tympanic membrane, external ear and ear canal normal.  Nose: Rhinorrhea present. No mucosal edema. Right sinus exhibits maxillary sinus tenderness. Right sinus exhibits no frontal sinus tenderness. Left sinus exhibits maxillary sinus tenderness. Left sinus exhibits no frontal sinus tenderness.  Mouth/Throat: Uvula is midline and mucous membranes are normal. Posterior oropharyngeal erythema present. No oropharyngeal exudate or tonsillar abscesses.  Pustule posterior right oropharynx  Eyes: Conjunctivae and EOM are normal. Pupils are equal, round, and reactive to light. No scleral icterus.  Neck: Normal range of motion. Neck supple.  Cardiovascular: Normal rate, regular rhythm, normal heart sounds and intact distal pulses.   No murmur heard. Pulmonary/Chest: Effort normal and breath sounds normal. No respiratory distress. She has no wheezes. She has no rales.  Musculoskeletal: She exhibits no edema.  Lymphadenopathy:    She has cervical adenopathy (tender R AC LAD, shotty L PC LAD).  Skin: Skin is warm and dry. No rash noted.       Assessment & Plan:   Problem List Items Addressed This Visit   Acute upper respiratory infections of unspecified site     Sinusitis with bronchitis. Given duration of sxs, I did recommend treatment with azithromycin to cover bacterial infection. CXR today - clear on my read, some central bronchitic changes.  Flu swab today - negative Red flags to return discussed. Supportive care as per instructions.    Relevant Medications      azithromycin (ZITHROMAX) tablet    Other Visit Diagnoses   Cough    -  Primary    Relevant Orders       DG Chest 2 View       POCT Influenza A/B (Completed)        Follow up plan: Return if symptoms worsen or fail to improve.

## 2013-02-12 NOTE — Assessment & Plan Note (Addendum)
Sinusitis with bronchitis. Given duration of sxs, I did recommend treatment with azithromycin to cover bacterial infection. CXR today - clear on my read, some central bronchitic changes. Flu swab today - negative Red flags to return discussed. Supportive care as per instructions.

## 2013-02-12 NOTE — Progress Notes (Signed)
Pre-visit discussion using our clinic review tool. No additional management support is needed unless otherwise documented below in the visit note.  

## 2013-02-12 NOTE — Patient Instructions (Signed)
You have upper respiratory infection.  Given duration of symptoms, I do want to treat you with azithromycin - sent in to pharmacy May use cough syrup for cough at night time. Push fluids and plenty of rest. Continue ibuprofen for inflammation Please return if you are not improving as expected, or if you have high fevers (>101.5) or difficulty swallowing or worsening productive cough. Call clinic with questions.  Good to see you today.

## 2013-03-19 ENCOUNTER — Ambulatory Visit: Payer: Commercial Managed Care - PPO

## 2013-03-30 ENCOUNTER — Ambulatory Visit
Admission: RE | Admit: 2013-03-30 | Discharge: 2013-03-30 | Disposition: A | Discharge status: Home / Self Care | Payer: Commercial Managed Care - PPO | Source: Ambulatory Visit

## 2013-03-30 DIAGNOSIS — Z1231 Encounter for screening mammogram for malignant neoplasm of breast: Secondary | ICD-10-CM

## 2013-04-02 ENCOUNTER — Encounter: Payer: Self-pay | Admitting: Family Medicine

## 2013-07-08 ENCOUNTER — Other Ambulatory Visit: Payer: Self-pay | Admitting: Family Medicine

## 2013-07-29 ENCOUNTER — Encounter: Payer: Self-pay | Admitting: Family Medicine

## 2013-07-29 ENCOUNTER — Ambulatory Visit (INDEPENDENT_AMBULATORY_CARE_PROVIDER_SITE_OTHER): Payer: Commercial Managed Care - PPO | Admitting: Family Medicine

## 2013-07-29 VITALS — BP 110/60 | HR 74 | Temp 98.1°F | Wt 147.2 lb

## 2013-07-29 DIAGNOSIS — R42 Dizziness and giddiness: Secondary | ICD-10-CM | POA: Insufficient documentation

## 2013-07-29 MED ORDER — MECLIZINE HCL 25 MG PO TABS: 25.0000 mg | ORAL_TABLET | Freq: Three times a day (TID) | ORAL | Status: DC | PRN

## 2013-07-29 NOTE — Patient Instructions (Addendum)
I think you have benign positional vertigo - treat with home exercise provided today and may use meclizine as needed for symptoms Out of work today.  Benign Positional Vertigo Vertigo means you feel like you or your surroundings are moving when they are not. Benign positional vertigo is the most common form of vertigo. Benign means that the cause of your condition is not serious. Benign positional vertigo is more common in older adults. CAUSES  Benign positional vertigo is the result of an upset in the labyrinth system. This is an area in the middle ear that helps control your balance. This may be caused by a viral infection, head injury, or repetitive motion. However, often no specific cause is found. SYMPTOMS  Symptoms of benign positional vertigo occur when you move your head or eyes in different directions. Some of the symptoms may include:  Loss of balance and falls.  Vomiting.  Blurred vision.  Dizziness.  Nausea.  Involuntary eye movements (nystagmus). DIAGNOSIS  Benign positional vertigo is usually diagnosed by physical exam. If the specific cause of your benign positional vertigo is unknown, your caregiver may perform imaging tests, such as magnetic resonance imaging (MRI) or computed tomography (CT). TREATMENT  Your caregiver may recommend movements or procedures to correct the benign positional vertigo. Medicines such as meclizine, benzodiazepines, and medicines for nausea may be used to treat your symptoms. In rare cases, if your symptoms are caused by certain conditions that affect the inner ear, you may need surgery. HOME CARE INSTRUCTIONS   Follow your caregiver's instructions.  Move slowly. Do not make sudden body or head movements.  Avoid driving.  Avoid operating heavy machinery.  Avoid performing any tasks that would be dangerous to you or others during a vertigo episode.  Drink enough fluids to keep your urine clear or pale yellow. SEEK IMMEDIATE MEDICAL CARE  IF:   You develop problems with walking, weakness, numbness, or using your arms, hands, or legs.  You have difficulty speaking.  You develop severe headaches.  Your nausea or vomiting continues or gets worse.  You develop visual changes.  Your family or friends notice any behavioral changes.  Your condition gets worse.  You have a fever.  You develop a stiff neck or sensitivity to light. MAKE SURE YOU:   Understand these instructions.  Will watch your condition.  Will get help right away if you are not doing well or get worse. Document Released: 09/25/2005 Document Revised: 03/12/2011 Document Reviewed: 09/07/2010 [facility] Patient Information 2015 [place], [place]. This information is not intended to replace advice given to you by your health care provider. Make sure you discuss any questions you have with your health care provider.

## 2013-07-29 NOTE — Assessment & Plan Note (Signed)
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 Sent home with modified epley and prescription for meclizine.

## 2013-07-29 NOTE — Progress Notes (Signed)
   BP 110/60  Pulse 74  Temp(Src) 98.1 F (36.7 C) (Oral)  Wt 147 lb 4 oz (66.792 kg)   CC: dizziness  Subjective:    Patient ID: Kathy Pugh, female    DOB: Apr 04, 1960, 53 y.o.   MRN: [id number]  HPI: Kathy Pugh is a 53 y.o. female presenting on 07/29/2013 for Dizziness   Woke up this morning at 4am and felt bed spinning. Describes room spinning around her. Some nausea without vomiting. Slept all morning which is also unusual. Yesterday felt more tired than usual. Mild dull frontal headache. Worse with looking downward or sudden head movements. May be some better but still persistent vertigo.  No recent fevers/chills, current congestion, recent cold or viral syndrome. No vision changes. No ringing in ears, no hearing changes. No unilateral weakness or numbness. No sick contacts at home.  Never had dizziness like this in past.  Lab Results  Component Value Date   CHOL 161 11/28/2012   HDL 51.20 11/28/2012   LDLCALC 98 11/28/2012   LDLDIRECT 91 07/29/2009   TRIG 60.0 11/28/2012   CHOLHDL 3 11/28/2012    Relevant past medical, surgical, family and social history reviewed and updated as indicated.  Allergies and medications reviewed and updated. Current Outpatient Prescriptions on File Prior to Visit  Medication Sig  . ANGELIQ 0.5-1 MG per tablet TAKE 1 TABLET EVERY DAY  . cetirizine (ZYRTEC) 10 MG tablet Take 10 mg by mouth daily.    . cholecalciferol (VITAMIN D) 1000 UNITS tablet Take 1,000 Units by mouth daily.  . vitamin C (ASCORBIC ACID) 500 MG tablet Take 500 mg by mouth daily.    . Ginkgo Biloba 40 MG TABS Take 1 tablet by mouth daily.   No current facility-administered medications on file prior to visit.    Review of Systems Per HPI unless specifically indicated above    Objective:    BP 110/60  Pulse 74  Temp(Src) 98.1 F (36.7 C) (Oral)  Wt 147 lb 4 oz (66.792 kg)  Physical Exam  Nursing note and vitals reviewed. Constitutional: She appears  well-developed and well-nourished. No distress.  HENT:  Head: Normocephalic and atraumatic.  Right Ear: Hearing, tympanic membrane, external ear and ear canal normal.  Left Ear: Hearing, tympanic membrane, external ear and ear canal normal.  Nose: Nose normal.  Mouth/Throat: Oropharynx is clear and moist. No oropharyngeal exudate.  Eyes: Conjunctivae and EOM are normal. Pupils are equal, round, and reactive to light. No scleral icterus.  Neck: Normal range of motion. Neck supple.  Cardiovascular: Normal rate, regular rhythm, normal heart sounds and intact distal pulses.   No murmur heard. Pulmonary/Chest: Effort normal and breath sounds normal. No respiratory distress. She has no wheezes. She has no rales.  Musculoskeletal: She exhibits no edema.  Neurological: She has normal strength. No cranial nerve deficit or sensory deficit. She displays a negative Romberg sign. Coordination and gait normal.  CN 2-12 intact Intact FTN, HTS Dix hallpike + on left s/p epley canalith repositioning maneuvers x2       Assessment & Plan:  Orthostatics normal today.  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 Sent home with modified epley and prescription for meclizine.        Follow up plan: No Follow-up on file.

## 2013-07-29 NOTE — Progress Notes (Signed)
Pre visit review using our clinic review tool, if applicable. No additional management support is needed unless otherwise documented below in the visit note. 

## 2013-08-31 ENCOUNTER — Emergency Department: Payer: Self-pay | Admitting: Emergency Medicine

## 2013-11-21 ENCOUNTER — Other Ambulatory Visit: Payer: Self-pay | Admitting: Family Medicine

## 2013-11-21 DIAGNOSIS — Z0000 Encounter for general adult medical examination without abnormal findings: Secondary | ICD-10-CM

## 2013-11-23 ENCOUNTER — Other Ambulatory Visit (INDEPENDENT_AMBULATORY_CARE_PROVIDER_SITE_OTHER): Payer: Commercial Managed Care - PPO

## 2013-11-23 DIAGNOSIS — Z13220 Encounter for screening for lipoid disorders: Secondary | ICD-10-CM

## 2013-11-23 DIAGNOSIS — Z0000 Encounter for general adult medical examination without abnormal findings: Secondary | ICD-10-CM

## 2013-11-23 LAB — BASIC METABOLIC PANEL
BUN: 11 mg/dL (ref 6–23)
CHLORIDE: 104 meq/L (ref 96–112)
CO2: 26 mEq/L (ref 19–32)
Calcium: 9.4 mg/dL (ref 8.4–10.5)
Creatinine, Ser: 0.9 mg/dL (ref 0.4–1.2)
GFR: 73.66 mL/min (ref >60.00)
GLUCOSE: 81 mg/dL (ref 70–99)
POTASSIUM: 4.5 meq/L (ref 3.5–5.1)
SODIUM: 139 meq/L (ref 135–145)

## 2013-11-23 LAB — LIPID PANEL
CHOLESTEROL: 177 mg/dL (ref 0–200)
HDL: 50.5 mg/dL (ref >39.00)
LDL CALC: 107 mg/dL — AB (ref 0–99)
NonHDL: 126.5
Total CHOL/HDL Ratio: 4
Triglycerides: 96 mg/dL (ref 0.0–149.0)
VLDL: 19.2 mg/dL (ref 0.0–40.0)

## 2013-11-30 ENCOUNTER — Ambulatory Visit (INDEPENDENT_AMBULATORY_CARE_PROVIDER_SITE_OTHER): Payer: Commercial Managed Care - PPO | Admitting: Family Medicine

## 2013-11-30 ENCOUNTER — Other Ambulatory Visit (HOSPITAL_COMMUNITY)
Admission: RE | Admit: 2013-11-30 | Discharge: 2013-11-30 | Disposition: A | Discharge status: Home / Self Care | Payer: Commercial Managed Care - PPO | Source: Ambulatory Visit | Attending: Family Medicine | Admitting: Family Medicine

## 2013-11-30 ENCOUNTER — Encounter: Payer: Self-pay | Admitting: Family Medicine

## 2013-11-30 VITALS — BP 100/60 | HR 80 | Temp 98.4°F | Ht 63.5 in | Wt 145.8 lb

## 2013-11-30 DIAGNOSIS — Z124 Encounter for screening for malignant neoplasm of cervix: Secondary | ICD-10-CM

## 2013-11-30 DIAGNOSIS — E559 Vitamin D deficiency, unspecified: Secondary | ICD-10-CM

## 2013-11-30 DIAGNOSIS — Z0000 Encounter for general adult medical examination without abnormal findings: Secondary | ICD-10-CM

## 2013-11-30 DIAGNOSIS — Z01419 Encounter for gynecological examination (general) (routine) without abnormal findings: Secondary | ICD-10-CM | POA: Diagnosis not present

## 2013-11-30 DIAGNOSIS — Z1211 Encounter for screening for malignant neoplasm of colon: Secondary | ICD-10-CM

## 2013-11-30 DIAGNOSIS — E785 Hyperlipidemia, unspecified: Secondary | ICD-10-CM | POA: Insufficient documentation

## 2013-11-30 LAB — HM PAP SMEAR: HM Pap smear: NORMAL

## 2013-11-30 MED ORDER — DROSPIRENONE-ESTRADIOL 0.25-0.5 MG PO TABS: 1.0000 | ORAL_TABLET | Freq: Every day | ORAL | Status: DC

## 2013-11-30 NOTE — Addendum Note (Signed)
Addended by: Royann Shivers A on: 11/30/2013 03:51 PM   Modules accepted: Orders

## 2013-11-30 NOTE — Assessment & Plan Note (Signed)
Mild off meds. Discussed dietary choices to improve LDL. Goal LDL for her likely <130 given fmhx (dad)

## 2013-11-30 NOTE — Progress Notes (Signed)
BP 100/60 mmHg  Pulse 80  Temp(Src) 98.4 F (36.9 C) (Oral)  Ht 5' 3.5" (1.613 m)  Wt 145 lb 12 oz (66.112 kg)  BMI 25.41 kg/m2   CC: CPE  Subjective:    Patient ID: Kathy Pugh, female    DOB: 11/28/1960, 53 y.o.   MRN: [id number]  HPI: Kathy Pugh is a 53 y.o. female presenting on 11/30/2013 for Annual Exam   Sunscreen use discussed. No sunburns in last year. No suspicious moles on skin. Seat belt use discussed   Preventative: COLONOSCOPY Date: 01/2004 normal [name]). Discussed. Would like to continue stool kits. Mammogram - birads 1 03/2013. Gets regularly at breast center. Well woman exam - paps always normal. Mom with ?h/o uterine cancer, abnormal cells that led to hysterectomy. given fm hx requests q2 yrs. Will do today. Flu shot at work Tdap 11/2012 Menopause - on angeliq for hot flashes and vag dryness. Requests slow taper off - will decrease to lower dose.  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 Good fruits and vegetables, lots of water. Yogurt for breakfast, 1 cup milk/day.  Relevant past medical, surgical, family and social history reviewed and updated as indicated. Interim medical history since our last visit reviewed. Allergies and medications reviewed and updated.  Current Outpatient Prescriptions on File Prior to Visit  Medication Sig  . cetirizine (ZYRTEC) 10 MG tablet Take 10 mg by mouth daily.    . cholecalciferol (VITAMIN D) 1000 UNITS tablet Take 1,000 Units by mouth daily.  . COD LIVER OIL PO Take 1 capsule by mouth daily.  . Ginkgo Biloba 40 MG TABS Take 1 tablet by mouth daily.  . meclizine (ANTIVERT) 25 MG tablet Take 1 tablet (25 mg total) by mouth 3 (three) times daily as needed for dizziness (sedation precautions).  . vitamin C (ASCORBIC ACID) 500 MG tablet Take 500 mg by  mouth daily.    . vitamin E (VITAMIN E) 400 UNIT capsule Take 400 Units by mouth daily.   No current facility-administered medications on file prior to visit.    Review of Systems  Constitutional: Negative for fever, chills, activity change, appetite change, fatigue and unexpected weight change.  HENT: Negative for hearing loss.   Eyes: Negative for visual disturbance.  Respiratory: Negative for cough, chest tightness, shortness of breath and wheezing.   Cardiovascular: Negative for chest pain, palpitations and leg swelling.  Gastrointestinal: Negative for nausea, vomiting, abdominal pain, diarrhea, constipation, blood in stool and abdominal distention.  Genitourinary: Negative for hematuria and difficulty urinating.  Musculoskeletal: Negative for myalgias, arthralgias and neck pain.  Skin: Negative for rash.  Neurological: Negative for dizziness, seizures, syncope and headaches.  Hematological: Negative for adenopathy. Does not bruise/bleed easily.  Psychiatric/Behavioral: Negative for dysphoric mood. The patient is not nervous/anxious.    Per HPI unless specifically indicated above     Objective:    BP 100/60 mmHg  Pulse 80  Temp(Src) 98.4 F (36.9 C) (Oral)  Ht 5' 3.5" (1.613 m)  Wt 145 lb 12 oz (66.112 kg)  BMI 25.41 kg/m2  Wt Readings from Last 3 Encounters:  11/30/13 145 lb 12 oz (66.112 kg)  07/29/13 147 lb 4 oz (66.792 kg)  02/12/13 147 lb 4 oz (66.792 kg)    Physical Exam  Constitutional: She is oriented to person, place, and time.  She appears well-developed and well-nourished. No distress.  HENT:  Head: Normocephalic and atraumatic.  Right Ear: Hearing, tympanic membrane, external ear and ear canal normal.  Left Ear: Hearing, tympanic membrane, external ear and ear canal normal.  Nose: Nose normal.  Mouth/Throat: Uvula is midline, oropharynx is clear and moist and mucous membranes are normal. No oropharyngeal exudate, posterior oropharyngeal edema or posterior  oropharyngeal erythema.  Eyes: Conjunctivae and EOM are normal. Pupils are equal, round, and reactive to light. No scleral icterus.  Neck: Normal range of motion. Neck supple. No thyromegaly present.  Cardiovascular: Normal rate, regular rhythm, normal heart sounds and intact distal pulses.   No murmur heard. Pulses:      Radial pulses are 2+ on the right side, and 2+ on the left side.  Pulmonary/Chest: Effort normal and breath sounds normal. No respiratory distress. She has no wheezes. She has no rales. Right breast exhibits no inverted nipple, no mass, no nipple discharge, no skin change and no tenderness. Left breast exhibits no inverted nipple, no mass, no nipple discharge, no skin change and no tenderness.  Abdominal: Soft. Bowel sounds are normal. She exhibits no distension and no mass. There is no tenderness. There is no rebound and no guarding.  Genitourinary: Vagina normal and uterus normal. Pelvic exam was performed with patient supine. There is no rash, tenderness, lesion or injury on the right labia. There is no rash, tenderness, lesion or injury on the left labia. Cervix exhibits no motion tenderness, no discharge and no friability. Right adnexum displays no mass, no tenderness and no fullness. Left adnexum displays no mass, no tenderness and no fullness.  Musculoskeletal: Normal range of motion. She exhibits no edema.  Lymphadenopathy:       Head (right side): No submental, no submandibular, no tonsillar, no preauricular and no posterior auricular adenopathy present.       Head (left side): No submental, no submandibular, no tonsillar, no preauricular and no posterior auricular adenopathy present.    She has no cervical adenopathy.    She has no axillary adenopathy.       Right axillary: No lateral adenopathy present.       Left axillary: No lateral adenopathy present.      Right: No supraclavicular adenopathy present.       Left: No supraclavicular adenopathy present.    Neurological: She is alert and oriented to person, place, and time.  CN grossly intact, station and gait intact  Skin: Skin is warm and dry. No rash noted.  Psychiatric: She has a normal mood and affect. Her behavior is normal. Judgment and thought content normal.  Nursing note and vitals reviewed.  Results for orders placed or performed in visit on 11/23/13  Lipid panel  Result Value Ref Range   Cholesterol 177 0 - 200 mg/dL   Triglycerides 96.0 0.0 - 149.0 mg/dL   HDL 50.50 >39.00 mg/dL   VLDL 19.2 0.0 - 40.0 mg/dL   LDL Cholesterol 107 (H) 0 - 99 mg/dL   Total CHOL/HDL Ratio 4    NonHDL 948.54   Basic metabolic panel  Result Value Ref Range   Sodium 139 135 - 145 mEq/L   Potassium 4.5 3.5 - 5.1 mEq/L   Chloride 104 96 - 112 mEq/L   CO2 26 19 - 32 mEq/L   Glucose, Bld 81 70 - 99 mg/dL   BUN 11 6 - 23 mg/dL   Creatinine, Ser 0.9 0.4 - 1.2 mg/dL   Calcium 9.4 8.4 -  10.5 mg/dL   GFR 73.66 >60.00 mL/min      Assessment & Plan:   Problem List Items Addressed This Visit    Vitamin D deficiency    Continue vit D supplement 1000 IU daily.    Hyperlipidemia    Mild off meds. Discussed dietary choices to improve LDL. Goal LDL for her likely <130 given fmhx (dad)    Healthcare maintenance - Primary    Preventative protocols reviewed and updated unless pt declined. Discussed healthy diet and lifestyle. Pelvic/pap and breast exam performed.     Other Visit Diagnoses    Colon cancer screening        Relevant Orders       Fecal occult blood, imunochemical        Follow up plan: No Follow-up on file.

## 2013-11-30 NOTE — Patient Instructions (Addendum)
Stool kit today. Try lower angeliq dose sent to pharmacy. Good to see you today, call us with questions.

## 2013-11-30 NOTE — Progress Notes (Signed)
Pre visit review using our clinic review tool, if applicable. No additional management support is needed unless otherwise documented below in the visit note. 

## 2013-11-30 NOTE — Assessment & Plan Note (Signed)
Continue vit D supplement 1000 IU daily.

## 2013-11-30 NOTE — Assessment & Plan Note (Addendum)
Preventative protocols reviewed and updated unless pt declined. Discussed healthy diet and lifestyle. Pelvic/pap and breast exam performed.

## 2013-12-02 LAB — CYTOLOGY - PAP

## 2013-12-03 ENCOUNTER — Encounter: Payer: Self-pay | Admitting: *Deleted

## 2013-12-18 ENCOUNTER — Other Ambulatory Visit: Payer: Commercial Managed Care - PPO

## 2013-12-18 DIAGNOSIS — Z1211 Encounter for screening for malignant neoplasm of colon: Secondary | ICD-10-CM

## 2013-12-18 LAB — FECAL OCCULT BLOOD, IMMUNOCHEMICAL: Fecal Occult Bld: NEGATIVE

## 2013-12-18 LAB — FECAL OCCULT BLOOD, GUAIAC: Fecal Occult Blood: NEGATIVE

## 2013-12-21 ENCOUNTER — Encounter: Payer: Self-pay | Admitting: *Deleted

## 2014-02-24 ENCOUNTER — Other Ambulatory Visit: Payer: Self-pay

## 2014-02-24 DIAGNOSIS — Z1231 Encounter for screening mammogram for malignant neoplasm of breast: Secondary | ICD-10-CM

## 2014-04-05 ENCOUNTER — Ambulatory Visit
Admission: RE | Admit: 2014-04-05 | Discharge: 2014-04-05 | Disposition: A | Discharge status: Home / Self Care | Payer: Commercial Managed Care - PPO | Source: Ambulatory Visit

## 2014-04-05 DIAGNOSIS — Z1231 Encounter for screening mammogram for malignant neoplasm of breast: Secondary | ICD-10-CM

## 2014-04-06 LAB — HM MAMMOGRAPHY: HM MAMMO: NORMAL

## 2014-04-07 ENCOUNTER — Encounter: Payer: Self-pay | Admitting: *Deleted

## 2014-06-07 ENCOUNTER — Other Ambulatory Visit: Payer: Self-pay | Admitting: Family Medicine

## 2014-06-25 ENCOUNTER — Encounter: Payer: Self-pay | Admitting: Internal Medicine

## 2014-06-25 ENCOUNTER — Ambulatory Visit (INDEPENDENT_AMBULATORY_CARE_PROVIDER_SITE_OTHER): Payer: Commercial Managed Care - PPO | Admitting: Internal Medicine

## 2014-06-25 VITALS — BP 112/70 | HR 75 | Temp 98.2°F | Wt 144.0 lb

## 2014-06-25 DIAGNOSIS — R300 Dysuria: Secondary | ICD-10-CM

## 2014-06-25 DIAGNOSIS — N390 Urinary tract infection, site not specified: Secondary | ICD-10-CM | POA: Insufficient documentation

## 2014-06-25 LAB — POCT URINALYSIS DIPSTICK
Bilirubin, UA: NEGATIVE
Glucose, UA: NEGATIVE
KETONES UA: NEGATIVE
Nitrite, UA: NEGATIVE
Protein, UA: NEGATIVE
SPEC GRAV UA: 1.015
Urobilinogen, UA: NEGATIVE
pH, UA: 7

## 2014-06-25 MED ORDER — SULFAMETHOXAZOLE-TRIMETHOPRIM 800-160 MG PO TABS: 1.0000 | ORAL_TABLET | Freq: Two times a day (BID) | ORAL | Status: DC

## 2014-06-25 NOTE — Assessment & Plan Note (Signed)
Very different than her usual symptoms but localized bladder tenderness and abnormal urine Will treat with septra Do culture if any recurrent symptoms

## 2014-06-25 NOTE — Progress Notes (Signed)
Subjective:    Patient ID: Kathy Pugh, female    DOB: 09/26/1960, 54 y.o.   MRN: [id number]  HPI Here due to urinary symptoms Has back pain Cloudy urine especially in AM---has soreness after going Started about 4 days Some increased frequency and urgency  Does get UTIs occasionally--usually gets dysuria and hematuria (hasn't had either) Using tylenol all week-- helps back. ??reducing low grade fever  Current Outpatient Prescriptions on File Prior to Visit  Medication Sig Dispense Refill  . ANGELIQ 0.25-0.5 MG TABS TAKE 1 TABLET BY MOUTH DAILY. 28 tablet 6  . cetirizine (ZYRTEC) 10 MG tablet Take 10 mg by mouth daily.      . cholecalciferol (VITAMIN D) 1000 UNITS tablet Take 1,000 Units by mouth daily.    . COD LIVER OIL PO Take 1 capsule by mouth daily.    . Ginkgo Biloba 40 MG TABS Take 1 tablet by mouth daily.    . meclizine (ANTIVERT) 25 MG tablet Take 1 tablet (25 mg total) by mouth 3 (three) times daily as needed for dizziness (sedation precautions). 30 tablet 0  . vitamin C (ASCORBIC ACID) 500 MG tablet Take 500 mg by mouth daily.      . vitamin E (VITAMIN E) 400 UNIT capsule Take 400 Units by mouth daily.     No current facility-administered medications on file prior to visit.    Allergies  Allergen Reactions  . Vicodin [Hydrocodone-Acetaminophen] Nausea Only    Past Medical History  Diagnosis Date  . History of chicken pox   . Headache(784.0)     frequent, takes tylenol  . Seasonal allergies   . Migraines     none in years  . Hx: UTI (urinary tract infection)     trimethoprim ppx  . Irritable bowel syndrome with diarrhea 2005    per GI, Dr. [name]  . GERD (gastroesophageal reflux disease) 2005    with esophageal stricture and HH per EGD  . Vitamin D deficiency     Past Surgical History  Procedure Laterality Date  . Tonsillectomy and adenoidectomy  1983  . Tubal ligation    . Hammer toe surgery  11/2009    left foot - hammer toe and bunion  .  Laparoscopic uterine nerve ablation  1998    for endometriosis  . Esophagogastroduodenoscopy  10/2003    GERD, HH, esoph stricture, neg H pylori  . Colonoscopy  01/2004    normal [name])    Family History  Problem Relation Age of Onset  . Hyperlipidemia Mother 88  . Diabetes Father   . Coronary artery disease Father 32  . Hypertension Sister   . Stroke Maternal Grandfather   . Cancer Neg Hx     ? mother with uterine    History   Social History  . Marital Status: Married    Spouse Name: N/A  . Number of Children: 1  . Years of Education: 12   Occupational History  . Sales    Social History Main Topics  . Smoking status: Former Smoker    Quit date: 03/02/1983  . Smokeless tobacco: Never Used  . Alcohol Use: No  . Drug Use: No  . Sexual Activity: Not on file   Other Topics Concern  . 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  Good fruits and vegetables, lots of water.  Yogurt for breakfast, 1 cup milk/day.   Review of Systems Some nausea but no vomiting Normal appetite No sweats or chills Bowels okay--going twice a day instead of once    Objective:   Physical Exam  Constitutional: She appears well-developed and well-nourished. No distress.  Abdominal: Soft. She exhibits no distension. There is no rebound and no guarding.  Mild suprapubic tenderness  Musculoskeletal:  No CVA tenderness Some low back sensitivity          Assessment & Plan:

## 2014-06-25 NOTE — Progress Notes (Signed)
Pre visit review using our clinic review tool, if applicable. No additional management support is needed unless otherwise documented below in the visit note. 

## 2014-10-25 ENCOUNTER — Ambulatory Visit (INDEPENDENT_AMBULATORY_CARE_PROVIDER_SITE_OTHER): Payer: Commercial Managed Care - PPO | Admitting: Family Medicine

## 2014-10-25 ENCOUNTER — Encounter: Payer: Self-pay | Admitting: Family Medicine

## 2014-10-25 ENCOUNTER — Other Ambulatory Visit: Payer: Self-pay | Admitting: Family Medicine

## 2014-10-25 VITALS — BP 100/64 | HR 76 | Temp 98.6°F | Wt 144.5 lb

## 2014-10-25 DIAGNOSIS — R197 Diarrhea, unspecified: Secondary | ICD-10-CM | POA: Diagnosis not present

## 2014-10-25 LAB — COMPREHENSIVE METABOLIC PANEL
ALBUMIN: 4.4 g/dL (ref 3.5–5.2)
ALT: 14 U/L (ref 0–35)
AST: 17 U/L (ref 0–37)
Alkaline Phosphatase: 54 U/L (ref 39–117)
BILIRUBIN TOTAL: 0.5 mg/dL (ref 0.2–1.2)
BUN: 14 mg/dL (ref 6–23)
CALCIUM: 9.8 mg/dL (ref 8.4–10.5)
CHLORIDE: 105 meq/L (ref 96–112)
CO2: 28 mEq/L (ref 19–32)
CREATININE: 0.76 mg/dL (ref 0.40–1.20)
GFR: 83.53 mL/min (ref >60.00)
Glucose, Bld: 89 mg/dL (ref 70–99)
Potassium: 4.2 mEq/L (ref 3.5–5.1)
SODIUM: 141 meq/L (ref 135–145)
TOTAL PROTEIN: 7.3 g/dL (ref 6.0–8.3)

## 2014-10-25 LAB — CBC WITH DIFFERENTIAL/PLATELET
BASOS ABS: 0 10*3/uL (ref 0.0–0.1)
BASOS PCT: 0.6 % (ref 0.0–3.0)
EOS ABS: 0 10*3/uL (ref 0.0–0.7)
Eosinophils Relative: 0.9 % (ref 0.0–5.0)
HCT: 39.7 % (ref 36.0–46.0)
HEMOGLOBIN: 13.2 g/dL (ref 12.0–15.0)
Lymphocytes Relative: 31.4 % (ref 12.0–46.0)
Lymphs Abs: 1.4 10*3/uL (ref 0.7–4.0)
MCHC: 33.2 g/dL (ref 30.0–36.0)
MCV: 87.3 fl (ref 78.0–100.0)
MONO ABS: 0.3 10*3/uL (ref 0.1–1.0)
Monocytes Relative: 5.9 % (ref 3.0–12.0)
Neutro Abs: 2.8 10*3/uL (ref 1.4–7.7)
Neutrophils Relative %: 61.2 % (ref 43.0–77.0)
PLATELETS: 280 10*3/uL (ref 150.0–400.0)
RBC: 4.54 Mil/uL (ref 3.87–5.11)
RDW: 13.4 % (ref 11.5–15.5)
WBC: 4.6 10*3/uL (ref 4.0–10.5)

## 2014-10-25 MED ORDER — CIPROFLOXACIN HCL 500 MG PO TABS: 500.0000 mg | ORAL_TABLET | Freq: Two times a day (BID) | ORAL | Status: DC

## 2014-10-25 NOTE — Assessment & Plan Note (Signed)
Anticipate infectious cause (bact vs viral). No red flags (no abd pain, fever, bloody diarrhea). Given severity (>1wk, >10 episodes per day), will treat with empiric cipro course, asked to collect specimen today prior to starting abx. Pt agrees with plan. Update if sxs worsening or persistent diarrhea.

## 2014-10-25 NOTE — Progress Notes (Signed)
BP 100/64 mmHg  Pulse 76  Temp(Src) 98.6 F (37 C) (Oral)  Wt 144 lb 8 oz (65.545 kg)   CC: diarrhea  Subjective:    Patient ID: Kathy Pugh, female    DOB: 16-Oct-1960, 54 y.o.   MRN: [id number]  HPI: Kathy Pugh is a 54 y.o. female presenting on 10/25/2014 for Diarrhea   Upset stomach for the past 1 week. No solid stools - all liquid/watery. 10 stools this morning. + cramping prior to stool, gassiness. Possibly feverish initially. Appetite down.   No fevers/chills, abd pain, blood or mucous in stool. No new restaurants/foods. Ate at [employer] in [place] same day as her mother, ate exact same food, both with GI upset but mother's quickly went away.  No recent travel. Uses well water.  Self treating with pepto bismol and mylanta.  No other sick contacts at home. No recent abx use  Grandson with high fever 2-3 wks ago but not GI or URI sxs.  Relevant past medical, surgical, family and social history reviewed and updated as indicated. Interim medical history since our last visit reviewed. Allergies and medications reviewed and updated. Current Outpatient Prescriptions on File Prior to Visit  Medication Sig  . ANGELIQ 0.25-0.5 MG TABS TAKE 1 TABLET BY MOUTH DAILY.  . cetirizine (ZYRTEC) 10 MG tablet Take 10 mg by mouth daily.    . cholecalciferol (VITAMIN D) 1000 UNITS tablet Take 1,000 Units by mouth daily.  . COD LIVER OIL PO Take 1 capsule by mouth daily.  . Ginkgo Biloba 40 MG TABS Take 1 tablet by mouth daily.  . meclizine (ANTIVERT) 25 MG tablet Take 1 tablet (25 mg total) by mouth 3 (three) times daily as needed for dizziness (sedation precautions).  . vitamin C (ASCORBIC ACID) 500 MG tablet Take 500 mg by mouth daily.    . vitamin E (VITAMIN E) 400 UNIT capsule Take 400 Units by mouth daily.   No current facility-administered medications on file prior to visit.    Review of Systems Per HPI unless specifically indicated in ROS section     Objective:     BP 100/64 mmHg  Pulse 76  Temp(Src) 98.6 F (37 C) (Oral)  Wt 144 lb 8 oz (65.545 kg)  Wt Readings from Last 3 Encounters:  10/25/14 144 lb 8 oz (65.545 kg)  06/25/14 144 lb (65.318 kg)  11/30/13 145 lb 12 oz (66.112 kg)   Body mass index is 25.19 kg/(m^2).  Physical Exam  Constitutional: She appears well-developed and well-nourished. No distress.  HENT:  Mouth/Throat: Oropharynx is clear and moist. No oropharyngeal exudate.  Cardiovascular: Normal rate, regular rhythm, normal heart sounds and intact distal pulses.   No murmur heard. Pulmonary/Chest: Effort normal and breath sounds normal. No respiratory distress. She has no wheezes. She has no rales.  Abdominal: Soft. Normal appearance and bowel sounds are normal. She exhibits no distension and no mass. There is no hepatosplenomegaly. There is tenderness (mild to palpation only) in the epigastric area and suprapubic area. There is no rebound, no guarding and no CVA tenderness.  Skin: Skin is warm and dry. There is pallor.  Psychiatric: She has a normal mood and affect.  Nursing note and vitals reviewed.      Assessment & Plan:   Problem List Items Addressed This Visit    Acute diarrhea - Primary    Anticipate infectious cause (bact vs viral). No red flags (no abd pain, fever, bloody diarrhea). Given severity (>1wk, >10  episodes per day), will treat with empiric cipro course, asked to collect specimen today prior to starting abx. Pt agrees with plan. Update if sxs worsening or persistent diarrhea.      Relevant Orders   Comprehensive metabolic panel   CBC with Differential/Platelet   Stool culture   C. difficile GDH and Toxin A/B       Follow up plan: Return if symptoms worsen or fail to improve.

## 2014-10-25 NOTE — Progress Notes (Signed)
Pre visit review using our clinic review tool, if applicable. No additional management support is needed unless otherwise documented below in the visit note. 

## 2014-10-25 NOTE — Addendum Note (Signed)
Addended by: Ellamae Sia on: 10/25/2014 11:40 AM   Modules accepted: Orders

## 2014-10-25 NOTE — Addendum Note (Signed)
Addended by: Ellamae Sia on: 10/25/2014 03:00 PM   Modules accepted: Orders

## 2014-10-25 NOTE — Patient Instructions (Addendum)
Blood work today Stool studies today. Then start cipro antibiotic course Small sips throughout the day. Bland diet with chicken broth, jello, etc. Update if not improving with treatment. Let us know right away if worsening abdominal pain, fever >101.  Food Choices to Help Relieve Diarrhea, Adult When you have diarrhea, the foods you eat and your eating habits are very important. Choosing the right foods and drinks can help relieve diarrhea. Also, because diarrhea can last up to 7 days, you need to replace lost fluids and electrolytes (such as sodium, potassium, and chloride) in order to help prevent dehydration.  WHAT GENERAL GUIDELINES DO I NEED TO FOLLOW?  Slowly drink 1 cup (8 oz) of fluid for each episode of diarrhea. If you are getting enough fluid, your urine will be clear or pale yellow.  Eat starchy foods. Some good choices include white rice, white toast, pasta, low-fiber cereal, baked potatoes (without the skin), saltine crackers, and bagels.  Avoid large servings of any cooked vegetables.  Limit fruit to two servings per day. A serving is  cup or 1 small piece.  Choose foods with less than 2 g of fiber per serving.  Limit fats to less than 8 tsp (38 g) per day.  Avoid fried foods.  Eat foods that have probiotics in them. Probiotics can be found in certain dairy products.  Avoid foods and beverages that may increase the speed at which food moves through the stomach and intestines (gastrointestinal tract). Things to avoid include:  High-fiber foods, such as dried fruit, raw fruits and vegetables, nuts, seeds, and whole grain foods.  Spicy foods and high-fat foods.  Foods and beverages sweetened with high-fructose corn syrup, honey, or sugar alcohols such as xylitol, sorbitol, and mannitol. WHAT FOODS ARE RECOMMENDED? Grains White rice. White, [place], or pita breads (fresh or toasted), including plain rolls, buns, or bagels. White pasta. Saltine, soda, or graham crackers.  Pretzels. Low-fiber cereal. Cooked cereals made with water (such as cornmeal, farina, or cream cereals). Plain muffins. Matzo. Melba toast. Zwieback.  Vegetables Potatoes (without the skin). Strained tomato and vegetable juices. Most well-cooked and canned vegetables without seeds. Tender lettuce. Fruits Cooked or canned applesauce, apricots, cherries, fruit cocktail, grapefruit, peaches, pears, or plums. Fresh bananas, apples without skin, cherries, grapes, cantaloupe, grapefruit, peaches, oranges, or plums.  Meat and Other Protein Products Baked or boiled chicken. Eggs. Tofu. Fish. Seafood. Smooth peanut butter. Ground or well-cooked tender beef, ham, veal, lamb, pork, or poultry.  Dairy Plain yogurt, kefir, and unsweetened liquid yogurt. Lactose-free milk, buttermilk, or soy milk. Plain hard cheese. Beverages Sport drinks. Clear broths. Diluted fruit juices (except prune). Regular, caffeine-free sodas such as ginger ale. Water. Decaffeinated teas. Oral rehydration solutions. Sugar-free beverages not sweetened with sugar alcohols. Other Bouillon, broth, or soups made from recommended foods.  The items listed above may not be a complete list of recommended foods or beverages. Contact your dietitian for more options. WHAT FOODS ARE NOT RECOMMENDED? Grains Whole grain, whole wheat, bran, or rye breads, rolls, pastas, crackers, and cereals. Wild or brown rice. Cereals that contain more than 2 g of fiber per serving. Corn tortillas or taco shells. Cooked or dry oatmeal. Granola. Popcorn. Vegetables Raw vegetables. Cabbage, broccoli, Brussels sprouts, artichokes, baked beans, beet greens, corn, kale, legumes, peas, sweet potatoes, and yams. Potato skins. Cooked spinach and cabbage. Fruits Dried fruit, including raisins and dates. Raw fruits. Stewed or dried prunes. Fresh apples with skin, apricots, mangoes, pears, raspberries, and strawberries.  Meat and  Other Protein Products Chunky peanut butter.  Nuts and seeds. Beans and lentils. Berniece Salines.  Dairy High-fat cheeses. Milk, chocolate milk, and beverages made with milk, such as milk shakes. Cream. Ice cream. Sweets and Desserts Sweet rolls, doughnuts, and sweet breads. Pancakes and waffles. Fats and Oils Butter. Cream sauces. Margarine. Salad oils. Plain salad dressings. Olives. Avocados.  Beverages Caffeinated beverages (such as coffee, tea, soda, or energy drinks). Alcoholic beverages. Fruit juices with pulp. Prune juice. Soft drinks sweetened with high-fructose corn syrup or sugar alcohols. Other Coconut. Hot sauce. Chili powder. Mayonnaise. Gravy. Cream-based or milk-based soups.  The items listed above may not be a complete list of foods and beverages to avoid. Contact your dietitian for more information. WHAT SHOULD I DO IF I BECOME DEHYDRATED? Diarrhea can sometimes lead to dehydration. Signs of dehydration include dark urine and dry mouth and skin. If you think you are dehydrated, you should rehydrate with an oral rehydration solution. These solutions can be purchased at pharmacies, retail stores, or online.  Drink -1 cup (120-240 mL) of oral rehydration solution each time you have an episode of diarrhea. If drinking this amount makes your diarrhea worse, try drinking smaller amounts more often. For example, drink 1-3 tsp (5-15 mL) every 5-10 minutes.  A general rule for staying hydrated is to drink 1-2 L of fluid per day. Talk to your health care provider about the specific amount you should be drinking each day. Drink enough fluids to keep your urine clear or pale yellow.   This information is not intended to replace advice given to you by your health care provider. Make sure you discuss any questions you have with your health care provider.   Document Released: 03/10/2003 Document Revised: 01/08/2014 Document Reviewed: 11/10/2012 Elsevier Interactive Patient Education [employer].

## 2014-10-26 LAB — C. DIFFICILE GDH AND TOXIN A/B
C. DIFF TOXIN A/B: NOT DETECTED
C. difficile GDH: NOT DETECTED

## 2014-10-29 LAB — STOOL CULTURE

## 2014-12-23 ENCOUNTER — Other Ambulatory Visit: Payer: Self-pay | Admitting: Family Medicine

## 2014-12-23 DIAGNOSIS — E559 Vitamin D deficiency, unspecified: Secondary | ICD-10-CM

## 2014-12-23 DIAGNOSIS — E785 Hyperlipidemia, unspecified: Secondary | ICD-10-CM

## 2014-12-24 ENCOUNTER — Other Ambulatory Visit (INDEPENDENT_AMBULATORY_CARE_PROVIDER_SITE_OTHER): Payer: Commercial Managed Care - PPO

## 2014-12-24 DIAGNOSIS — E559 Vitamin D deficiency, unspecified: Secondary | ICD-10-CM | POA: Diagnosis not present

## 2014-12-24 DIAGNOSIS — E785 Hyperlipidemia, unspecified: Secondary | ICD-10-CM | POA: Diagnosis not present

## 2014-12-24 LAB — LIPID PANEL
Cholesterol: 142 mg/dL (ref 0–200)
HDL: 61.2 mg/dL (ref >39.00)
LDL Cholesterol: 73 mg/dL (ref 0–99)
NonHDL: 80.63
Total CHOL/HDL Ratio: 2
Triglycerides: 40 mg/dL (ref 0.0–149.0)
VLDL: 8 mg/dL (ref 0.0–40.0)

## 2014-12-24 LAB — VITAMIN D 25 HYDROXY (VIT D DEFICIENCY, FRACTURES): VITD: 48.35 ng/mL (ref 30.00–100.00)

## 2015-01-06 ENCOUNTER — Encounter: Payer: Self-pay | Admitting: Family Medicine

## 2015-01-06 ENCOUNTER — Ambulatory Visit (INDEPENDENT_AMBULATORY_CARE_PROVIDER_SITE_OTHER): Payer: Managed Care, Other (non HMO) | Admitting: Family Medicine

## 2015-01-06 VITALS — BP 92/58 | HR 65 | Temp 98.4°F | Ht 64.0 in | Wt 148.2 lb

## 2015-01-06 DIAGNOSIS — E559 Vitamin D deficiency, unspecified: Secondary | ICD-10-CM | POA: Diagnosis not present

## 2015-01-06 DIAGNOSIS — Z0000 Encounter for general adult medical examination without abnormal findings: Secondary | ICD-10-CM

## 2015-01-06 DIAGNOSIS — Z1211 Encounter for screening for malignant neoplasm of colon: Secondary | ICD-10-CM

## 2015-01-06 DIAGNOSIS — E785 Hyperlipidemia, unspecified: Secondary | ICD-10-CM

## 2015-01-06 DIAGNOSIS — Z79890 Hormone replacement therapy (postmenopausal): Secondary | ICD-10-CM

## 2015-01-06 DIAGNOSIS — Z780 Asymptomatic menopausal state: Secondary | ICD-10-CM | POA: Insufficient documentation

## 2015-01-06 MED ORDER — DROSPIRENONE-ESTRADIOL 0.25-0.5 MG PO TABS: 1.0000 | ORAL_TABLET | ORAL | Status: DC

## 2015-01-06 NOTE — Patient Instructions (Addendum)
Try angeliq every other day for next few months. If doing well, may stop.  Pass by lab to pick up stool kit. You are doing wonderful today! Keep up the good work with healthy diet and lifestyle changes Return as needed or in 1 year for next physical.  Health Maintenance, Female Adopting a healthy lifestyle and getting preventive care can go a long way to promote health and wellness. Talk with your health care provider about what schedule of regular examinations is right for you. This is a good chance for you to check in with your provider about disease prevention and staying healthy. In between checkups, there are plenty of things you can do on your own. Experts have done a lot of research about which lifestyle changes and preventive measures are most likely to keep you healthy. Ask your health care provider for more information. WEIGHT AND DIET  Eat a healthy diet  Be sure to include plenty of vegetables, fruits, low-fat dairy products, and lean protein.  Do not eat a lot of foods high in solid fats, added sugars, or salt.  Get regular exercise. This is one of the most important things you can do for your health.  Most adults should exercise for at least 150 minutes each week. The exercise should increase your heart rate and make you sweat (moderate-intensity exercise).  Most adults should also do strengthening exercises at least twice a week. This is in addition to the moderate-intensity exercise.  Maintain a healthy weight  Body mass index (BMI) is a measurement that can be used to identify possible weight problems. It estimates body fat based on height and weight. Your health care provider can help determine your BMI and help you achieve or maintain a healthy weight.  For females 78 years of age and older:   A BMI below 18.5 is considered underweight.  A BMI of 18.5 to 24.9 is normal.  A BMI of 25 to 29.9 is considered overweight.  A BMI of 30 and above is considered obese.   Watch levels of cholesterol and blood lipids  You should start having your blood tested for lipids and cholesterol at 57 years of age, then have this test every 5 years.  You may need to have your cholesterol levels checked more often if:  Your lipid or cholesterol levels are high.  You are older than 57 years of age.  You are at high risk for heart disease.  CANCER SCREENING   Lung Cancer  Lung cancer screening is recommended for adults 12-81 years old who are at high risk for lung cancer because of a history of smoking.  A yearly low-dose CT scan of the lungs is recommended for people who:  Currently smoke.  Have quit within the past 15 years.  Have at least a 30-pack-year history of smoking. A pack year is smoking an average of one pack of cigarettes a day for 1 year.  Yearly screening should continue until it has been 15 years since you quit.  Yearly screening should stop if you develop a health problem that would prevent you from having lung cancer treatment.  Breast Cancer  Practice breast self-awareness. This means understanding how your breasts normally appear and feel.  It also means doing regular breast self-exams. Let your health care provider know about any changes, no matter how small.  If you are in your 20s or 30s, you should have a clinical breast exam (CBE) by a health care provider every 1-3 years  as part of a regular health exam.  If you are 27 or older, have a CBE every year. Also consider having a breast X-ray (mammogram) every year.  If you have a family history of breast cancer, talk to your health care provider about genetic screening.  If you are at high risk for breast cancer, talk to your health care provider about having an MRI and a mammogram every year.  Breast cancer gene (BRCA) assessment is recommended for women who have family members with BRCA-related cancers. BRCA-related cancers  include:  Breast.  Ovarian.  Tubal.  Peritoneal cancers.  Results of the assessment will determine the need for genetic counseling and BRCA1 and BRCA2 testing. Cervical Cancer Your health care provider may recommend that you be screened regularly for cancer of the pelvic organs (ovaries, uterus, and vagina). This screening involves a pelvic examination, including checking for microscopic changes to the surface of your cervix (Pap test). You may be encouraged to have this screening done every 3 years, beginning at age 77.  For women ages 17-65, health care providers may recommend pelvic exams and Pap testing every 3 years, or they may recommend the Pap and pelvic exam, combined with testing for human papilloma virus (HPV), every 5 years. Some types of HPV increase your risk of cervical cancer. Testing for HPV may also be done on women of any age with unclear Pap test results.  Other health care providers may not recommend any screening for nonpregnant women who are considered low risk for pelvic cancer and who do not have symptoms. Ask your health care provider if a screening pelvic exam is right for you.  If you have had past treatment for cervical cancer or a condition that could lead to cancer, you need Pap tests and screening for cancer for at least 20 years after your treatment. If Pap tests have been discontinued, your risk factors (such as having a new sexual partner) need to be reassessed to determine if screening should resume. Some women have medical problems that increase the chance of getting cervical cancer. In these cases, your health care provider may recommend more frequent screening and Pap tests. Colorectal Cancer  This type of cancer can be detected and often prevented.  Routine colorectal cancer screening usually begins at 57 years of age and continues through 57 years of age.  Your health care provider may recommend screening at an earlier age if you have risk factors for  colon cancer.  Your health care provider may also recommend using home test kits to check for hidden blood in the stool.  A small camera at the end of a tube can be used to examine your colon directly (sigmoidoscopy or colonoscopy). This is done to check for the earliest forms of colorectal cancer.  Routine screening usually begins at age 7.  Direct examination of the colon should be repeated every 5-10 years through 57 years of age. However, you may need to be screened more often if early forms of precancerous polyps or small growths are found. Skin Cancer  Check your skin from head to toe regularly.  Tell your health care provider about any new moles or changes in moles, especially if there is a change in a mole's shape or color.  Also tell your health care provider if you have a mole that is larger than the size of a pencil eraser.  Always use sunscreen. Apply sunscreen liberally and repeatedly throughout the day.  Protect yourself by wearing long sleeves,  pants, a wide-brimmed hat, and sunglasses whenever you are outside. HEART DISEASE, DIABETES, AND HIGH BLOOD PRESSURE   High blood pressure causes heart disease and increases the risk of stroke. High blood pressure is more likely to develop in:  People who have blood pressure in the high end of the normal range (130-139/85-89 mm Hg).  People who are overweight or obese.  People who are African American.  If you are 24-66 years of age, have your blood pressure checked every 3-5 years. If you are 81 years of age or older, have your blood pressure checked every year. You should have your blood pressure measured twice--once when you are at a hospital or clinic, and once when you are not at a hospital or clinic. Record the average of the two measurements. To check your blood pressure when you are not at a hospital or clinic, you can use:  An automated blood pressure machine at a pharmacy.  A home blood pressure monitor.  If you  are between 16 years and 28 years old, ask your health care provider if you should take aspirin to prevent strokes.  Have regular diabetes screenings. This involves taking a blood sample to check your fasting blood sugar level.  If you are at a normal weight and have a low risk for diabetes, have this test once every three years after 57 years of age.  If you are overweight and have a high risk for diabetes, consider being tested at a younger age or more often. PREVENTING INFECTION  Hepatitis B  If you have a higher risk for hepatitis B, you should be screened for this virus. You are considered at high risk for hepatitis B if:  You were born in a country where hepatitis B is common. Ask your health care provider which countries are considered high risk.  Your parents were born in a high-risk country, and you have not been immunized against hepatitis B (hepatitis B vaccine).  You have HIV or AIDS.  You use needles to inject street drugs.  You live with someone who has hepatitis B.  You have had sex with someone who has hepatitis B.  You get hemodialysis treatment.  You take certain medicines for conditions, including cancer, organ transplantation, and autoimmune conditions. Hepatitis C  Blood testing is recommended for:  Everyone born from [birth year] through 1967.  Anyone with known risk factors for hepatitis C. Sexually transmitted infections (STIs)  You should be screened for sexually transmitted infections (STIs) including gonorrhea and chlamydia if:  You are sexually active and are younger than 57 years of age.  You are older than 57 years of age and your health care provider tells you that you are at risk for this type of infection.  Your sexual activity has changed since you were last screened and you are at an increased risk for chlamydia or gonorrhea. Ask your health care provider if you are at risk.  If you do not have HIV, but are at risk, it may be recommended that you  take a prescription medicine daily to prevent HIV infection. This is called pre-exposure prophylaxis (PrEP). You are considered at risk if:  You are sexually active and do not regularly use condoms or know the HIV status of your partner(s).  You take drugs by injection.  You are sexually active with a partner who has HIV. Talk with your health care provider about whether you are at high risk of being infected with HIV. If you choose to  begin PrEP, you should first be tested for HIV. You should then be tested every 3 months for as long as you are taking PrEP.  PREGNANCY   If you are premenopausal and you may become pregnant, ask your health care provider about preconception counseling.  If you may become pregnant, take 400 to 800 micrograms (mcg) of folic acid every day.  If you want to prevent pregnancy, talk to your health care provider about birth control (contraception). OSTEOPOROSIS AND MENOPAUSE   Osteoporosis is a disease in which the bones lose minerals and strength with aging. This can result in serious bone fractures. Your risk for osteoporosis can be identified using a bone density scan.  If you are 7 years of age or older, or if you are at risk for osteoporosis and fractures, ask your health care provider if you should be screened.  Ask your health care provider whether you should take a calcium or vitamin D supplement to lower your risk for osteoporosis.  Menopause may have certain physical symptoms and risks.  Hormone replacement therapy may reduce some of these symptoms and risks. Talk to your health care provider about whether hormone replacement therapy is right for you.  HOME CARE INSTRUCTIONS   Schedule regular health, dental, and eye exams.  Stay current with your immunizations.   Do not use any tobacco products including cigarettes, chewing tobacco, or electronic cigarettes.  If you are pregnant, do not drink alcohol.  If you are breastfeeding, limit how  much and how often you drink alcohol.  Limit alcohol intake to no more than 1 drink per day for nonpregnant women. One drink equals 12 ounces of beer, 5 ounces of wine, or 1 ounces of hard liquor.  Do not use street drugs.  Do not share needles.  Ask your health care provider for help if you need support or information about quitting drugs.  Tell your health care provider if you often feel depressed.  Tell your health care provider if you have ever been abused or do not feel safe at home.   This information is not intended to replace advice given to you by your health care provider. Make sure you discuss any questions you have with your health care provider.   Document Released: 07/03/2010 Document Revised: 01/08/2014 Document Reviewed: 11/19/2012 Elsevier Interactive Patient Education [employer].

## 2015-01-06 NOTE — Progress Notes (Signed)
Pre visit review using our clinic review tool, if applicable. No additional management support is needed unless otherwise documented below in the visit note. 

## 2015-01-06 NOTE — Assessment & Plan Note (Addendum)
Marked improvement with healthy diet changes. Congratulated patient.

## 2015-01-06 NOTE — Assessment & Plan Note (Signed)
Now repleted. Continue regularly.

## 2015-01-06 NOTE — Assessment & Plan Note (Signed)
On angeliq for last 6 yrs for hot flashes and atrophic vaginitis - last year we decreased dose and she has done well. Asks about continued drop in dose. Interested. Will decrease to QOD dosing for next few months then stop. Update [place] if any concerns or recurrent sxs.

## 2015-01-06 NOTE — Progress Notes (Signed)
BP 92/58 mmHg  Pulse 65  Temp(Src) 98.4 F (36.9 C) (Oral)  Ht 5\' 4"  (1.626 m)  Wt 148 lb 4 oz (67.246 kg)  BMI 25.43 kg/m2  SpO2 98%   CC: CPE  Subjective:    Patient ID: Kathy Pugh, female    DOB: March 19, 1960, 55 y.o.   MRN: [id number]  HPI: Kathy Pugh is a 55 y.o. female presenting on 01/06/2015 for Annual Exam   On angeliq for vaginal dryness and hot flashes for last 6 years. S/p menopause 12 yrs ago.   Goal weight 134lbs.   Preventative: COLONOSCOPY Date: 01/2004 normal [name]). Discussed. Would like to continue stool kits. Mammogram - birads 1 04/2014. Gets regularly at breast center. Well woman exam - paps always normal. Mom with ?h/o uterine cancer, abnormal cells that led to hysterectomy. given fm hx requests q2 yrs. Will be due 2018. Flu shot at work Tdap 11/2012 Menopause - on angeliq for hot flashes and vag dryness. Requests slow taper off - will decrease to lower dose. Sunscreen use discussed. No sunburns in last year. No suspicious moles on skin. 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 Good fruits and vegetables, lots of water. Yogurt for breakfast, 1 cup milk/day.  Relevant past medical, surgical, family and social history reviewed and updated as indicated. Interim medical history since our last visit reviewed. Allergies and medications reviewed and updated. Current Outpatient Prescriptions on File Prior to Visit  Medication Sig  . cetirizine (ZYRTEC) 10 MG tablet Take 10 mg by mouth daily.    . cholecalciferol (VITAMIN D) 1000 UNITS tablet Take 1,000 Units by mouth daily.  . COD LIVER OIL PO Take 1 capsule by mouth daily.  . Ginkgo Biloba 40 MG TABS Take 1 tablet by mouth daily.  . meclizine (ANTIVERT) 25 MG tablet Take 1 tablet  (25 mg total) by mouth 3 (three) times daily as needed for dizziness (sedation precautions).  . vitamin C (ASCORBIC ACID) 500 MG tablet Take 500 mg by mouth daily.    . vitamin E (VITAMIN E) 400 UNIT capsule Take 400 Units by mouth daily.   No current facility-administered medications on file prior to visit.    Review of Systems  Constitutional: Negative for fever, chills, activity change, appetite change, fatigue and unexpected weight change.  HENT: Negative for hearing loss.   Eyes: Negative for visual disturbance.  Respiratory: Negative for cough, chest tightness, shortness of breath and wheezing.   Cardiovascular: Negative for chest pain, palpitations and leg swelling.  Gastrointestinal: Negative for nausea, vomiting, abdominal pain, diarrhea, constipation, blood in stool and abdominal distention.  Genitourinary: Negative for hematuria and difficulty urinating.  Musculoskeletal: Negative for myalgias, arthralgias and neck pain.  Skin: Negative for rash.  Neurological: Negative for dizziness, seizures, syncope and headaches.  Hematological: Negative for adenopathy. Does not bruise/bleed easily.  Psychiatric/Behavioral: Negative for dysphoric mood. The patient is not nervous/anxious.    Per HPI unless specifically indicated in ROS section     Objective:    BP 92/58 mmHg  Pulse 65  Temp(Src) 98.4 F (36.9 C) (Oral)  Ht 5\' 4"  (1.626 m)  Wt 148 lb 4 oz (67.246 kg)  BMI 25.43 kg/m2  SpO2 98%  Wt Readings from Last 3 Encounters:  01/06/15 148 lb  4 oz (67.246 kg)  10/25/14 144 lb 8 oz (65.545 kg)  06/25/14 144 lb (65.318 kg)   Physical Exam  Constitutional: She is oriented to person, place, and time. She appears well-developed and well-nourished. No distress.  HENT:  Head: Normocephalic and atraumatic.  Right Ear: Hearing, tympanic membrane, external ear and ear canal normal.  Left Ear: Hearing, tympanic membrane, external ear and ear canal normal.  Nose: Nose normal.    Mouth/Throat: Uvula is midline, oropharynx is clear and moist and mucous membranes are normal. No oropharyngeal exudate, posterior oropharyngeal edema or posterior oropharyngeal erythema.  Eyes: Conjunctivae and EOM are normal. Pupils are equal, round, and reactive to light. No scleral icterus.  Neck: Normal range of motion. Neck supple. No thyromegaly present.  Cardiovascular: Normal rate, regular rhythm, normal heart sounds and intact distal pulses.   No murmur heard. Pulses:      Radial pulses are 2+ on the right side, and 2+ on the left side.  Pulmonary/Chest: Effort normal and breath sounds normal. No respiratory distress. She has no wheezes. She has no rales.  Abdominal: Soft. Bowel sounds are normal. She exhibits no distension and no mass. There is no tenderness. There is no rebound and no guarding.  Musculoskeletal: Normal range of motion. She exhibits no edema.  Lymphadenopathy:    She has no cervical adenopathy.  Neurological: She is alert and oriented to person, place, and time.  CN grossly intact, station and gait intact  Skin: Skin is warm and dry. No rash noted.  Psychiatric: She has a normal mood and affect. Her behavior is normal. Judgment and thought content normal.  Nursing note and vitals reviewed.  Results for orders placed or performed in visit on 12/24/14  VITAMIN D 25 Hydroxy (Vit-D Deficiency, Fractures)  Result Value Ref Range   VITD 48.35 30.00 - 100.00 ng/mL  Lipid panel  Result Value Ref Range   Cholesterol 142 0 - 200 mg/dL   Triglycerides 40.0 0.0 - 149.0 mg/dL   HDL 61.20 >39.00 mg/dL   VLDL 8.0 0.0 - 40.0 mg/dL   LDL Cholesterol 73 0 - 99 mg/dL   Total CHOL/HDL Ratio 2    NonHDL 80.63       Assessment & Plan:   Problem List Items Addressed This Visit    Vitamin D deficiency    Now repleted. Continue regularly.      Post-menopause on HRT (hormone replacement therapy)    On angeliq for last 6 yrs for hot flashes and atrophic vaginitis - last  year we decreased dose and she has done well. Asks about continued drop in dose. Interested. Will decrease to QOD dosing for next few months then stop. Update [place] if any concerns or recurrent sxs.      Hyperlipidemia    Marked improvement with healthy diet changes. Congratulated patient.      Healthcare maintenance - Primary    Preventative protocols reviewed and updated unless pt declined. Discussed healthy diet and lifestyle.        Other Visit Diagnoses    Special screening for malignant neoplasms, colon        Relevant Orders    Fecal occult blood, imunochemical        Follow up plan: Return in about 1 year (around 01/06/2016), or as needed, for annual exam, prior fasting for blood work.

## 2015-01-06 NOTE — Assessment & Plan Note (Signed)
Preventative protocols reviewed and updated unless pt declined. Discussed healthy diet and lifestyle.  

## 2015-01-08 NOTE — Addendum Note (Signed)
Addended by: Ria Bush on: 01/08/2015 06:32 PM   Modules accepted: Miquel Dunn

## 2015-02-28 ENCOUNTER — Other Ambulatory Visit: Payer: Self-pay

## 2015-02-28 DIAGNOSIS — Z1231 Encounter for screening mammogram for malignant neoplasm of breast: Secondary | ICD-10-CM

## 2015-04-11 ENCOUNTER — Ambulatory Visit
Admission: RE | Admit: 2015-04-11 | Discharge: 2015-04-11 | Disposition: A | Discharge status: Home / Self Care | Payer: Managed Care, Other (non HMO) | Source: Ambulatory Visit

## 2015-04-11 ENCOUNTER — Other Ambulatory Visit: Payer: Self-pay

## 2015-04-11 DIAGNOSIS — Z1231 Encounter for screening mammogram for malignant neoplasm of breast: Secondary | ICD-10-CM

## 2015-04-12 LAB — HM MAMMOGRAPHY

## 2015-04-13 ENCOUNTER — Encounter: Payer: Self-pay | Admitting: *Deleted

## 2015-06-01 ENCOUNTER — Ambulatory Visit (INDEPENDENT_AMBULATORY_CARE_PROVIDER_SITE_OTHER): Payer: Managed Care, Other (non HMO) | Admitting: Internal Medicine

## 2015-06-01 ENCOUNTER — Encounter: Payer: Self-pay | Admitting: Internal Medicine

## 2015-06-01 ENCOUNTER — Ambulatory Visit: Payer: Managed Care, Other (non HMO) | Admitting: Adult Health

## 2015-06-01 VITALS — BP 102/64 | HR 82 | Temp 99.0°F | Wt 148.5 lb

## 2015-06-01 DIAGNOSIS — S86811A Strain of other muscle(s) and tendon(s) at lower leg level, right leg, initial encounter: Secondary | ICD-10-CM

## 2015-06-01 NOTE — Progress Notes (Signed)
Pre visit review using our clinic review tool, if applicable. No additional management support is needed unless otherwise documented below in the visit note. 

## 2015-06-01 NOTE — Patient Instructions (Signed)

## 2015-06-01 NOTE — Progress Notes (Signed)
Subjective:    Patient ID: Kathy Pugh, female    DOB: [birth year], 55 y.o.   MRN: [id number]  HPI  Pt presents to the clinic today with c/o right leg pain. This started 1 month ago after she was playing outside with her grand kids. She heard a "pop" and immediately felt pain in her right hamstring. That did improve but 2 weeks later, she fell up some steps. During that time, she heard a "pop" and felt pain in her right calf. She describes the pain as achy and throbbing. She denies redness, swelling, numbness or tingling. She is having some difficulty walking. She has tried Aspercream, [employer], Aleve, Biofreeze and heat with some relief.  Review of Systems  Past Medical History  Diagnosis Date  . History of chicken pox   . Headache(784.0)     frequent, takes tylenol  . Seasonal allergies   . Migraines     none in years  . Hx: UTI (urinary tract infection)     trimethoprim ppx  . Irritable bowel syndrome with diarrhea 2005    per GI, Dr. [name]  . GERD (gastroesophageal reflux disease) 2005    with esophageal stricture and HH per EGD  . Vitamin D deficiency     Current Outpatient Prescriptions  Medication Sig Dispense Refill  . cetirizine (ZYRTEC) 10 MG tablet Take 10 mg by mouth daily.      . cholecalciferol (VITAMIN D) 1000 UNITS tablet Take 1,000 Units by mouth daily.    . COD LIVER OIL PO Take 1 capsule by mouth daily.    . Drospirenone-Estradiol (ANGELIQ) 0.25-0.5 MG TABS Take 1 tablet by mouth every other day. 28 tablet 6  . Ginkgo Biloba 40 MG TABS Take 1 tablet by mouth daily.    . meclizine (ANTIVERT) 25 MG tablet Take 1 tablet (25 mg total) by mouth 3 (three) times daily as needed for dizziness (sedation precautions). 30 tablet 0  . vitamin C (ASCORBIC ACID) 500 MG tablet Take 500 mg by mouth daily.      . vitamin E (VITAMIN E) 400 UNIT capsule Take 400 Units by mouth daily.     No current facility-administered medications for this visit.    Allergies    Allergen Reactions  . Vicodin [Hydrocodone-Acetaminophen] Nausea Only    Family History  Problem Relation Age of Onset  . Hyperlipidemia Mother 45  . Diabetes Father   . Coronary artery disease Father 76  . Hypertension Sister   . Stroke Maternal Grandfather   . Cancer Neg Hx     ? mother with uterine    Social History   Social History  . Marital Status: Married    Spouse Name: N/A  . Number of Children: 1  . Years of Education: 12   Occupational History  . Sales    Social History Main Topics  . Smoking status: Former Smoker    Quit date: 03/02/1983  . Smokeless tobacco: Never Used  . Alcohol Use: No  . Drug Use: No  . Sexual Activity: Not on file   Other Topics Concern  . 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  Good fruits and vegetables, lots of water.  Yogurt for breakfast, 1 cup milk/day.  Constitutional: Denies fever, malaise, fatigue, headache or abrupt weight changes.  Respiratory: Denies difficulty breathing, shortness of breath, cough or sputum production.   Cardiovascular: Denies chest pain, chest tightness, palpitations or swelling in the hands or feet.  Musculoskeletal: Pt reports right leg pain. Denies decrease in range of motion, difficulty with gait, or joint pain and swelling.  Neurological: Denies dizziness, difficulty with memory, difficulty with speech or problems with balance and coordination.    No other specific complaints in a complete review of systems (except as listed in HPI above).     Objective:   Physical Exam   BP 102/64 mmHg  Pulse 82  Temp(Src) 99 F (37.2 C) (Oral)  Wt 148 lb 8 oz (67.359 kg)  SpO2 98% Wt Readings from Last 3 Encounters:  06/01/15 148 lb 8 oz (67.359 kg)  01/06/15 148 lb 4 oz (67.246 kg)   10/25/14 144 lb 8 oz (65.545 kg)    General: Appears her stated age, well developed, well nourished in NAD. Skin: Warm, dry and intact. No redness or warmth of RLE. Cardiovascular: Normal rate and rhythm. S1,S2 noted.  No murmur, rubs or gallops noted. Pulmonary/Chest: Normal effort and positive vesicular breath sounds. No respiratory distress. No wheezes, rales or ronchi noted.  Musculoskeletal: Normal flexion, extension, internal and external rotation of the right hip. Normal flexion and extension of the right knee. No pain with palpation of the calf. No swelling of the right calf. Strength 4/5 RLE, 5/5 LLE. Neurological: Alert and oriented. Sensation intact to BLE.   BMET    Component Value Date/Time   NA 141 10/25/2014 0948   NA 142 07/29/2009   K 4.2 10/25/2014 0948   K 4.0 07/29/2009   CL 105 10/25/2014 0948   CL 103 07/29/2009   CO2 28 10/25/2014 0948   CO2 25 07/29/2009   GLUCOSE 89 10/25/2014 0948   BUN 14 10/25/2014 0948   BUN 10 07/29/2009   CREATININE 0.76 10/25/2014 0948   CREATININE 0.86 07/29/2009   CALCIUM 9.8 10/25/2014 0948   CALCIUM 9.3 07/29/2009    Lipid Panel     Component Value Date/Time   CHOL 142 12/24/2014 0904   CHOL 159 07/29/2009   TRIG 40.0 12/24/2014 0904   TRIG 39 07/29/2009   HDL 61.20 12/24/2014 0904   CHOLHDL 2 12/24/2014 0904   VLDL 8.0 12/24/2014 0904   LDLCALC 73 12/24/2014 0904    CBC    Component Value Date/Time   WBC 4.6 10/25/2014 0948   RBC 4.54 10/25/2014 0948   HGB 13.2 10/25/2014 0948   HCT 39.7 10/25/2014 0948   PLT 280.0 10/25/2014 0948   MCV 87.3 10/25/2014 0948   MCHC 33.2 10/25/2014 0948   RDW 13.4 10/25/2014 0948   LYMPHSABS 1.4 10/25/2014 0948   MONOABS 0.3 10/25/2014 0948   EOSABS 0.0 10/25/2014 0948   BASOSABS 0.0 10/25/2014 0948    Hgb A1C No results found for: HGBA1C      Assessment & Plan:   Muscle strain of right calf:  Exam benign No concern for clot at this time Reassurance provided  that this should resolve with time Information given on RICE  RTC as needed or if symptoms persist or worsen

## 2016-01-15 ENCOUNTER — Other Ambulatory Visit: Payer: Self-pay | Admitting: Family Medicine

## 2016-01-15 DIAGNOSIS — E559 Vitamin D deficiency, unspecified: Secondary | ICD-10-CM

## 2016-01-15 DIAGNOSIS — E785 Hyperlipidemia, unspecified: Secondary | ICD-10-CM

## 2016-01-15 DIAGNOSIS — Z1159 Encounter for screening for other viral diseases: Secondary | ICD-10-CM

## 2016-01-18 ENCOUNTER — Other Ambulatory Visit: Payer: Managed Care, Other (non HMO)

## 2016-01-26 ENCOUNTER — Other Ambulatory Visit (HOSPITAL_COMMUNITY)
Admission: RE | Admit: 2016-01-26 | Discharge: 2016-01-26 | Disposition: A | Discharge status: Home / Self Care | Payer: Managed Care, Other (non HMO) | Source: Ambulatory Visit | Attending: Family Medicine | Admitting: Family Medicine

## 2016-01-26 ENCOUNTER — Encounter: Payer: Self-pay | Admitting: Family Medicine

## 2016-01-26 ENCOUNTER — Ambulatory Visit (INDEPENDENT_AMBULATORY_CARE_PROVIDER_SITE_OTHER): Payer: Managed Care, Other (non HMO) | Admitting: Family Medicine

## 2016-01-26 VITALS — BP 110/82 | HR 76 | Temp 98.2°F | Ht 63.0 in | Wt 144.5 lb

## 2016-01-26 DIAGNOSIS — M25561 Pain in right knee: Secondary | ICD-10-CM | POA: Insufficient documentation

## 2016-01-26 DIAGNOSIS — Z1151 Encounter for screening for human papillomavirus (HPV): Secondary | ICD-10-CM | POA: Insufficient documentation

## 2016-01-26 DIAGNOSIS — Z79890 Hormone replacement therapy (postmenopausal): Secondary | ICD-10-CM

## 2016-01-26 DIAGNOSIS — Z124 Encounter for screening for malignant neoplasm of cervix: Secondary | ICD-10-CM

## 2016-01-26 DIAGNOSIS — Z0000 Encounter for general adult medical examination without abnormal findings: Secondary | ICD-10-CM | POA: Diagnosis not present

## 2016-01-26 DIAGNOSIS — Z01411 Encounter for gynecological examination (general) (routine) with abnormal findings: Secondary | ICD-10-CM | POA: Insufficient documentation

## 2016-01-26 DIAGNOSIS — E559 Vitamin D deficiency, unspecified: Secondary | ICD-10-CM | POA: Diagnosis not present

## 2016-01-26 DIAGNOSIS — Z1159 Encounter for screening for other viral diseases: Secondary | ICD-10-CM

## 2016-01-26 DIAGNOSIS — E785 Hyperlipidemia, unspecified: Secondary | ICD-10-CM

## 2016-01-26 DIAGNOSIS — G8929 Other chronic pain: Secondary | ICD-10-CM

## 2016-01-26 LAB — HEPATITIS C ANTIBODY: HCV Ab: NEGATIVE

## 2016-01-26 LAB — HM PAP SMEAR: HM Pap smear: NORMAL

## 2016-01-26 NOTE — Assessment & Plan Note (Addendum)
Anticipate PFPS, slowed improvement - ongoing since injury 6+ months ago.  Provided with exercises today from [facility] pt advisor, rec knee sleeve use. Update if not improving with treatment, consider imaging.

## 2016-01-26 NOTE — Progress Notes (Signed)
BP 110/82   Pulse 76   Temp 98.2 F (36.8 C) (Oral)   Ht '5\' 3"'  (1.6 m)   Wt 144 lb 8 oz (65.5 kg)   BMI 25.60 kg/m    CC: CPE Subjective:    Patient ID: Kathy Pugh, female    DOB: 07/18/1960, 56 y.o.   MRN: [id number]  HPI: Kathy Pugh is a 56 y.o. female presenting on 01/26/2016 for Annual Exam   Had fall in May while walking up bonus room - hit L shoulder and R knee - still trouble with R knee - unable to put weight on knee, unable to kneel.   Preventative: COLONOSCOPY Date: 01/2004 normal [name]). Discussed. Would like to do stool kit - did not complete last year, brought today.  Mammogram - WNL 04/2015 at breast center.  Well woman exam - paps always normal. Mom with ?h/o uterine cancer, abnormal cells that led to hysterectomy. given fm hx requests q2 yrs. Will be due 2018. Flu shot at work Tdap 11/2012 Menopause ~2003 at age 22. On angeliq for hot flashes and vag dryness. On low dose.  Seat belt use discussed  Sunscreen use discussed. No suspicious moles on skin. 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 Yogurt for breakfast, 1 cup milk/day. Overall healthy.   Relevant past medical, surgical, family and social history reviewed and updated as indicated. Interim medical history since our last visit reviewed. Allergies and medications reviewed and updated. Current Outpatient Prescriptions on File Prior to Visit  Medication Sig  . cetirizine (ZYRTEC) 10 MG tablet Take 10 mg by mouth daily.    . cholecalciferol (VITAMIN D) 1000 UNITS tablet Take 1,000 Units by mouth daily.  . COD LIVER OIL PO Take 1 capsule by mouth daily.  . Drospirenone-Estradiol (ANGELIQ) 0.25-0.5 MG TABS Take 1 tablet by mouth every other day.    . Ginkgo Biloba 40 MG TABS Take 1 tablet by mouth daily.  . vitamin C (ASCORBIC ACID) 500 MG tablet Take 500 mg by mouth daily.    . vitamin E (VITAMIN E) 400 UNIT capsule Take 400 Units by mouth daily.   No current facility-administered medications on file prior to visit.     Review of Systems  Constitutional: Negative for activity change, appetite change, chills, fatigue, fever and unexpected weight change.  HENT: Negative for hearing loss.   Eyes: Negative for visual disturbance.  Respiratory: Negative for cough, chest tightness, shortness of breath and wheezing.   Cardiovascular: Negative for chest pain, palpitations and leg swelling.  Gastrointestinal: Negative for abdominal distention, abdominal pain, blood in stool, constipation, diarrhea, nausea and vomiting.  Genitourinary: Negative for difficulty urinating and hematuria.  Musculoskeletal: Negative for arthralgias, myalgias and neck pain.  Skin: Negative for rash.  Neurological: Negative for dizziness, seizures, syncope and headaches.  Hematological: Negative for adenopathy. Bruises/bleeds easily (lifelong).  Psychiatric/Behavioral: Negative for dysphoric mood. The patient is not nervous/anxious.    Per HPI unless specifically indicated in ROS section     Objective:    BP 110/82   Pulse 76   Temp 98.2 F (36.8 C) (Oral)   Ht '5\' 3"'  (1.6 m)   Wt 144 lb 8 oz (65.5 kg)   BMI 25.60 kg/m  Wt Readings from Last 3 Encounters:  01/26/16 144 lb 8 oz (65.5 kg)  06/01/15 148 lb 8 oz (67.4 kg)  01/06/15 148 lb 4 oz (67.2 kg)    Physical Exam  Constitutional: She is oriented to person, place, and time. She appears well-developed and well-nourished. No distress.  HENT:  Head: Normocephalic and atraumatic.  Right Ear: Hearing, tympanic membrane, external ear and ear canal normal.  Left Ear: Hearing, tympanic membrane, external ear and ear canal normal.  Nose: Nose normal.  Mouth/Throat: Uvula is midline, oropharynx is clear  and moist and mucous membranes are normal. No oropharyngeal exudate, posterior oropharyngeal edema or posterior oropharyngeal erythema.  Eyes: Conjunctivae and EOM are normal. Pupils are equal, round, and reactive to light. No scleral icterus.  Neck: Normal range of motion. Neck supple. No thyromegaly present.  Cardiovascular: Normal rate, regular rhythm, normal heart sounds and intact distal pulses.   No murmur heard. Pulses:      Radial pulses are 2+ on the right side, and 2+ on the left side.  Pulmonary/Chest: Effort normal and breath sounds normal. No respiratory distress. She has no wheezes. She has no rales. Right breast exhibits no inverted nipple, no mass, no nipple discharge, no skin change and no tenderness. Left breast exhibits no inverted nipple, no mass, no nipple discharge, no skin change and no tenderness.  Abdominal: Soft. Bowel sounds are normal. She exhibits no distension and no mass. There is no tenderness. There is no rebound and no guarding.  Genitourinary: Vagina normal and uterus normal. Pelvic exam was performed with patient supine. There is no rash, tenderness or lesion on the right labia. There is no rash, tenderness or lesion on the left labia. Cervix exhibits no motion tenderness, no discharge and no friability. Right adnexum displays no mass and no tenderness. Left adnexum displays no mass and no tenderness.  Musculoskeletal: Normal range of motion. She exhibits no edema.  R knee WNL L Knee exam: No deformity on inspection. No pain with palpation of knee landmarks. No effusion/swelling noted. FROM in flex/extension without crepitus. No popliteal fullness (but tender to palpation). Neg drawer test (tender to test). Neg mcmurray test. No pain with valgus/varus stress. ++ PFgrind. No abnormal patellar mobility.   Lymphadenopathy:       Head (right side): No submental, no submandibular, no tonsillar, no preauricular, no posterior auricular and no occipital adenopathy  present.       Head (left side): No submental, no submandibular, no tonsillar, no preauricular, no posterior auricular and no occipital adenopathy present.    She has no cervical adenopathy.    She has no axillary adenopathy.       Right axillary: No lateral adenopathy present.       Left axillary: No lateral adenopathy present.      Right: No supraclavicular adenopathy present.       Left: No supraclavicular adenopathy present.  Neurological: She is alert and oriented to person, place, and time.  CN grossly intact, station and gait intact  Skin: Skin is warm and dry. No rash noted.  Psychiatric: She has a normal mood and affect. Her behavior is normal. Judgment and thought content normal.  Nursing note and vitals reviewed.  Results for orders placed or performed in visit on 04/13/15  HM MAMMOGRAPHY  Result Value Ref Range   HM Mammogram 0-4 Bi-Rad 0-4 Bi-Rad, Self Reported Normal  [facility] MAMMOGRAPHY  Result Value Ref Range   HM Mammogram 0-4 Bi-Rad 0-4 Bi-Rad, Self Reported  Normal      Assessment & Plan:   Problem List Items Addressed This Visit    Healthcare maintenance - Primary    Preventative protocols reviewed and updated unless pt declined. Discussed healthy diet and lifestyle.       Hyperlipidemia    Chronic, stable. Update FLP.       Post-menopause on HRT (hormone replacement therapy)    Stable on angeliq QOD. Breakthrough sxs if goes to every 3rd day.       Right knee pain    Anticipate PFPS, slowed improvement - ongoing since injury 6+ months ago.  Provided with exercises today from [facility] pt advisor, rec knee sleeve use. Update if not improving with treatment, consider imaging.       Vitamin D deficiency    Update vit D.      Relevant Orders   VITAMIN D 25 Hydroxy (Vit-D Deficiency, Fractures)    Other Visit Diagnoses    Encounter for hepatitis C screening test for low risk patient           Follow up plan: Return in about 1 year (around 01/25/2017) for  annual exam, prior fasting for blood work.  Ria Bush, MD

## 2016-01-26 NOTE — Patient Instructions (Addendum)
Labs today. Be on the look out for Shingrix new shingles shot - when it comes out on the market, check for insurance coverage and then call us to get it.  For knee - possible patellofemoral pain - do exercises provided today, use knee sleeve.  Good to see you today, call us with questions.  Health Maintenance, Female Introduction Adopting a healthy lifestyle and getting preventive care can go a long way to promote health and wellness. Talk with your health care provider about what schedule of regular examinations is right for you. This is a good chance for you to check in with your provider about disease prevention and staying healthy. In between checkups, there are plenty of things you can do on your own. Experts have done a lot of research about which lifestyle changes and preventive measures are most likely to keep you healthy. Ask your health care provider for more information. Weight and diet Eat a healthy diet  Be sure to include plenty of vegetables, fruits, low-fat dairy products, and lean protein.  Do not eat a lot of foods high in solid fats, added sugars, or salt.  Get regular exercise. This is one of the most important things you can do for your health.  Most adults should exercise for at least 150 minutes each week. The exercise should increase your heart rate and make you sweat (moderate-intensity exercise).  Most adults should also do strengthening exercises at least twice a week. This is in addition to the moderate-intensity exercise. Maintain a healthy weight  Body mass index (BMI) is a measurement that can be used to identify possible weight problems. It estimates body fat based on height and weight. Your health care provider can help determine your BMI and help you achieve or maintain a healthy weight.  For females 10 years of age and older:  A BMI below 18.5 is considered underweight.  A BMI of 18.5 to 24.9 is normal.  A BMI of 25 to 29.9 is considered  overweight.  A BMI of 30 and above is considered obese. Watch levels of cholesterol and blood lipids  You should start having your blood tested for lipids and cholesterol at 58 years of age, then have this test every 5 years.  You may need to have your cholesterol levels checked more often if:  Your lipid or cholesterol levels are high.  You are older than 58 years of age.  You are at high risk for heart disease. Cancer screening Lung Cancer  Lung cancer screening is recommended for adults 61-73 years old who are at high risk for lung cancer because of a history of smoking.  A yearly low-dose CT scan of the lungs is recommended for people who:  Currently smoke.  Have quit within the past 15 years.  Have at least a 30-pack-year history of smoking. A pack year is smoking an average of one pack of cigarettes a day for 1 year.  Yearly screening should continue until it has been 15 years since you quit.  Yearly screening should stop if you develop a health problem that would prevent you from having lung cancer treatment. Breast Cancer  Practice breast self-awareness. This means understanding how your breasts normally appear and feel.  It also means doing regular breast self-exams. Let your health care provider know about any changes, no matter how small.  If you are in your 20s or 30s, you should have a clinical breast exam (CBE) by a health care provider every 1-3 years  as part of a regular health exam.  If you are 69 or older, have a CBE every year. Also consider having a breast X-ray (mammogram) every year.  If you have a family history of breast cancer, talk to your health care provider about genetic screening.  If you are at high risk for breast cancer, talk to your health care provider about having an MRI and a mammogram every year.  Breast cancer gene (BRCA) assessment is recommended for women who have family members with BRCA-related cancers. BRCA-related cancers  include:  Breast.  Ovarian.  Tubal.  Peritoneal cancers.  Results of the assessment will determine the need for genetic counseling and BRCA1 and BRCA2 testing. Cervical Cancer  Your health care provider may recommend that you be screened regularly for cancer of the pelvic organs (ovaries, uterus, and vagina). This screening involves a pelvic examination, including checking for microscopic changes to the surface of your cervix (Pap test). You may be encouraged to have this screening done every 3 years, beginning at age 45.  For women ages 44-65, health care providers may recommend pelvic exams and Pap testing every 3 years, or they may recommend the Pap and pelvic exam, combined with testing for human papilloma virus (HPV), every 5 years. Some types of HPV increase your risk of cervical cancer. Testing for HPV may also be done on women of any age with unclear Pap test results.  Other health care providers may not recommend any screening for nonpregnant women who are considered low risk for pelvic cancer and who do not have symptoms. Ask your health care provider if a screening pelvic exam is right for you.  If you have had past treatment for cervical cancer or a condition that could lead to cancer, you need Pap tests and screening for cancer for at least 20 years after your treatment. If Pap tests have been discontinued, your risk factors (such as having a new sexual partner) need to be reassessed to determine if screening should resume. Some women have medical problems that increase the chance of getting cervical cancer. In these cases, your health care provider may recommend more frequent screening and Pap tests. Colorectal Cancer  This type of cancer can be detected and often prevented.  Routine colorectal cancer screening usually begins at 58 years of age and continues through 58 years of age.  Your health care provider may recommend screening at an earlier age if you have risk factors  for colon cancer.  Your health care provider may also recommend using home test kits to check for hidden blood in the stool.  A small camera at the end of a tube can be used to examine your colon directly (sigmoidoscopy or colonoscopy). This is done to check for the earliest forms of colorectal cancer.  Routine screening usually begins at age 24.  Direct examination of the colon should be repeated every 5-10 years through 58 years of age. However, you may need to be screened more often if early forms of precancerous polyps or small growths are found. Skin Cancer  Check your skin from head to toe regularly.  Tell your health care provider about any new moles or changes in moles, especially if there is a change in a mole's shape or color.  Also tell your health care provider if you have a mole that is larger than the size of a pencil eraser.  Always use sunscreen. Apply sunscreen liberally and repeatedly throughout the day.  Protect yourself by wearing long  sleeves, pants, a wide-brimmed hat, and sunglasses whenever you are outside. Heart disease, diabetes, and high blood pressure  High blood pressure causes heart disease and increases the risk of stroke. High blood pressure is more likely to develop in:  People who have blood pressure in the high end of the normal range (130-139/85-89 mm Hg).  People who are overweight or obese.  People who are African American.  If you are 63-58 years of age, have your blood pressure checked every 3-5 years. If you are 42 years of age or older, have your blood pressure checked every year. You should have your blood pressure measured twice-once when you are at a hospital or clinic, and once when you are not at a hospital or clinic. Record the average of the two measurements. To check your blood pressure when you are not at a hospital or clinic, you can use:  An automated blood pressure machine at a pharmacy.  A home blood pressure monitor.  If you  are between 33 years and 35 years old, ask your health care provider if you should take aspirin to prevent strokes.  Have regular diabetes screenings. This involves taking a blood sample to check your fasting blood sugar level.  If you are at a normal weight and have a low risk for diabetes, have this test once every three years after 58 years of age.  If you are overweight and have a high risk for diabetes, consider being tested at a younger age or more often. Preventing infection Hepatitis B  If you have a higher risk for hepatitis B, you should be screened for this virus. You are considered at high risk for hepatitis B if:  You were born in a country where hepatitis B is common. Ask your health care provider which countries are considered high risk.  Your parents were born in a high-risk country, and you have not been immunized against hepatitis B (hepatitis B vaccine).  You have HIV or AIDS.  You use needles to inject street drugs.  You live with someone who has hepatitis B.  You have had sex with someone who has hepatitis B.  You get hemodialysis treatment.  You take certain medicines for conditions, including cancer, organ transplantation, and autoimmune conditions. Hepatitis C  Blood testing is recommended for:  Everyone born from [birth year] through 1967.  Anyone with known risk factors for hepatitis C. Sexually transmitted infections (STIs)  You should be screened for sexually transmitted infections (STIs) including gonorrhea and chlamydia if:  You are sexually active and are younger than 58 years of age.  You are older than 58 years of age and your health care provider tells you that you are at risk for this type of infection.  Your sexual activity has changed since you were last screened and you are at an increased risk for chlamydia or gonorrhea. Ask your health care provider if you are at risk.  If you do not have HIV, but are at risk, it may be recommended that you  take a prescription medicine daily to prevent HIV infection. This is called pre-exposure prophylaxis (PrEP). You are considered at risk if:  You are sexually active and do not regularly use condoms or know the HIV status of your partner(s).  You take drugs by injection.  You are sexually active with a partner who has HIV. Talk with your health care provider about whether you are at high risk of being infected with HIV. If you choose to begin  PrEP, you should first be tested for HIV. You should then be tested every 3 months for as long as you are taking PrEP. Pregnancy  If you are premenopausal and you may become pregnant, ask your health care provider about preconception counseling.  If you may become pregnant, take 400 to 800 micrograms (mcg) of folic acid every day.  If you want to prevent pregnancy, talk to your health care provider about birth control (contraception). Osteoporosis and menopause  Osteoporosis is a disease in which the bones lose minerals and strength with aging. This can result in serious bone fractures. Your risk for osteoporosis can be identified using a bone density scan.  If you are 41 years of age or older, or if you are at risk for osteoporosis and fractures, ask your health care provider if you should be screened.  Ask your health care provider whether you should take a calcium or vitamin D supplement to lower your risk for osteoporosis.  Menopause may have certain physical symptoms and risks.  Hormone replacement therapy may reduce some of these symptoms and risks. Talk to your health care provider about whether hormone replacement therapy is right for you. Follow these instructions at home:  Schedule regular health, dental, and eye exams.  Stay current with your immunizations.  Do not use any tobacco products including cigarettes, chewing tobacco, or electronic cigarettes.  If you are pregnant, do not drink alcohol.  If you are breastfeeding, limit  how much and how often you drink alcohol.  Limit alcohol intake to no more than 1 drink per day for nonpregnant women. One drink equals 12 ounces of beer, 5 ounces of wine, or 1 ounces of hard liquor.  Do not use street drugs.  Do not share needles.  Ask your health care provider for help if you need support or information about quitting drugs.  Tell your health care provider if you often feel depressed.  Tell your health care provider if you have ever been abused or do not feel safe at home. This information is not intended to replace advice given to you by your health care provider. Make sure you discuss any questions you have with your health care provider. Document Released: 07/03/2010 Document Revised: 05/26/2015 Document Reviewed: 09/21/2014  2017 Elsevier

## 2016-01-26 NOTE — Assessment & Plan Note (Signed)
Stable on angeliq QOD. Breakthrough sxs if goes to every 3rd day.

## 2016-01-26 NOTE — Assessment & Plan Note (Signed)
Update vit D.

## 2016-01-26 NOTE — Addendum Note (Signed)
Addended by: Inocencio Homes on: 01/26/2016 06:37 PM   Modules accepted: Orders

## 2016-01-26 NOTE — Progress Notes (Signed)
Pre visit review using our clinic review tool, if applicable. No additional management support is needed unless otherwise documented below in the visit note. 

## 2016-01-26 NOTE — Assessment & Plan Note (Signed)
Chronic, stable. Update FLP.

## 2016-01-26 NOTE — Assessment & Plan Note (Signed)
Preventative protocols reviewed and updated unless pt declined. Discussed healthy diet and lifestyle.  

## 2016-01-27 LAB — LIPID PANEL
CHOL/HDL RATIO: 2
Cholesterol: 151 mg/dL (ref 0–200)
HDL: 63.2 mg/dL (ref >39.00)
LDL CALC: 76 mg/dL (ref 0–99)
NONHDL: 88.28
Triglycerides: 61 mg/dL (ref 0.0–149.0)
VLDL: 12.2 mg/dL (ref 0.0–40.0)

## 2016-01-27 LAB — BASIC METABOLIC PANEL
BUN: 12 mg/dL (ref 6–23)
CALCIUM: 9.7 mg/dL (ref 8.4–10.5)
CO2: 28 meq/L (ref 19–32)
Chloride: 102 mEq/L (ref 96–112)
Creatinine, Ser: 0.75 mg/dL (ref 0.40–1.20)
GFR: 84.44 mL/min (ref >60.00)
GLUCOSE: 87 mg/dL (ref 70–99)
Potassium: 4.4 mEq/L (ref 3.5–5.1)
Sodium: 139 mEq/L (ref 135–145)

## 2016-01-27 LAB — VITAMIN D 25 HYDROXY (VIT D DEFICIENCY, FRACTURES): VITD: 44.23 ng/mL (ref 30.00–100.00)

## 2016-01-27 LAB — TSH: TSH: 2.52 u[IU]/mL (ref 0.35–4.50)

## 2016-01-30 ENCOUNTER — Encounter: Payer: Self-pay | Admitting: *Deleted

## 2016-01-31 LAB — CYTOLOGY - PAP
Diagnosis: NEGATIVE
HPV: NOT DETECTED

## 2016-02-01 ENCOUNTER — Other Ambulatory Visit: Payer: Self-pay | Admitting: Family Medicine

## 2016-02-01 ENCOUNTER — Other Ambulatory Visit (INDEPENDENT_AMBULATORY_CARE_PROVIDER_SITE_OTHER): Payer: Managed Care, Other (non HMO)

## 2016-02-01 DIAGNOSIS — Z1211 Encounter for screening for malignant neoplasm of colon: Secondary | ICD-10-CM

## 2016-02-01 LAB — FECAL OCCULT BLOOD, IMMUNOCHEMICAL: Fecal Occult Bld: NEGATIVE

## 2016-02-01 LAB — FECAL OCCULT BLOOD, GUAIAC: FECAL OCCULT BLD: NEGATIVE

## 2016-02-03 ENCOUNTER — Telehealth: Payer: Self-pay | Admitting: Family Medicine

## 2016-02-03 ENCOUNTER — Encounter: Payer: Self-pay | Admitting: Family Medicine

## 2016-02-03 LAB — FECAL OCCULT BLOOD, GUAIAC: Fecal Occult Blood: NEGATIVE

## 2016-02-03 NOTE — Telephone Encounter (Signed)
Patient returned call about her lab results.  I let her know the ifob was negative and to repeat in 1 year.

## 2016-02-03 NOTE — Telephone Encounter (Signed)
Noted. Thanks.

## 2016-02-07 ENCOUNTER — Encounter: Payer: Self-pay | Admitting: *Deleted

## 2016-02-24 ENCOUNTER — Other Ambulatory Visit: Payer: Self-pay | Admitting: Family Medicine

## 2016-03-02 ENCOUNTER — Other Ambulatory Visit: Payer: Self-pay | Admitting: Family Medicine

## 2016-03-02 DIAGNOSIS — Z1231 Encounter for screening mammogram for malignant neoplasm of breast: Secondary | ICD-10-CM

## 2016-04-16 ENCOUNTER — Ambulatory Visit
Admission: RE | Admit: 2016-04-16 | Discharge: 2016-04-16 | Disposition: A | Discharge status: Home / Self Care | Payer: Managed Care, Other (non HMO) | Source: Ambulatory Visit | Attending: Family Medicine | Admitting: Family Medicine

## 2016-04-16 DIAGNOSIS — Z1231 Encounter for screening mammogram for malignant neoplasm of breast: Secondary | ICD-10-CM

## 2016-04-17 LAB — HM MAMMOGRAPHY

## 2016-04-18 ENCOUNTER — Encounter: Payer: Self-pay | Admitting: *Deleted

## 2017-01-20 ENCOUNTER — Other Ambulatory Visit: Payer: Self-pay | Admitting: Family Medicine

## 2017-01-20 DIAGNOSIS — E559 Vitamin D deficiency, unspecified: Secondary | ICD-10-CM

## 2017-01-20 DIAGNOSIS — E538 Deficiency of other specified B group vitamins: Secondary | ICD-10-CM

## 2017-01-20 DIAGNOSIS — E785 Hyperlipidemia, unspecified: Secondary | ICD-10-CM

## 2017-01-22 ENCOUNTER — Other Ambulatory Visit (INDEPENDENT_AMBULATORY_CARE_PROVIDER_SITE_OTHER): Payer: Managed Care, Other (non HMO)

## 2017-01-22 DIAGNOSIS — E538 Deficiency of other specified B group vitamins: Secondary | ICD-10-CM

## 2017-01-22 DIAGNOSIS — R7989 Other specified abnormal findings of blood chemistry: Secondary | ICD-10-CM

## 2017-01-22 DIAGNOSIS — E785 Hyperlipidemia, unspecified: Secondary | ICD-10-CM

## 2017-01-22 DIAGNOSIS — E559 Vitamin D deficiency, unspecified: Secondary | ICD-10-CM | POA: Diagnosis not present

## 2017-01-22 LAB — BASIC METABOLIC PANEL
BUN: 13 mg/dL (ref 6–23)
CO2: 30 mEq/L (ref 19–32)
Calcium: 9.4 mg/dL (ref 8.4–10.5)
Chloride: 104 mEq/L (ref 96–112)
Creatinine, Ser: 0.69 mg/dL (ref 0.40–1.20)
GFR: 92.65 mL/min (ref >60.00)
Glucose, Bld: 88 mg/dL (ref 70–99)
POTASSIUM: 3.9 meq/L (ref 3.5–5.1)
SODIUM: 140 meq/L (ref 135–145)

## 2017-01-22 LAB — LIPID PANEL
CHOL/HDL RATIO: 3
CHOLESTEROL: 160 mg/dL (ref 0–200)
HDL: 54.1 mg/dL (ref >39.00)
LDL Cholesterol: 95 mg/dL (ref 0–99)
NonHDL: 106.24
TRIGLYCERIDES: 55 mg/dL (ref 0.0–149.0)
VLDL: 11 mg/dL (ref 0.0–40.0)

## 2017-01-22 LAB — VITAMIN B12: VITAMIN B 12: 1006 pg/mL — AB (ref 211–911)

## 2017-01-22 LAB — VITAMIN D 25 HYDROXY (VIT D DEFICIENCY, FRACTURES): VITD: 63.24 ng/mL (ref 30.00–100.00)

## 2017-01-28 ENCOUNTER — Encounter: Payer: Self-pay | Admitting: Family Medicine

## 2017-01-28 ENCOUNTER — Ambulatory Visit (INDEPENDENT_AMBULATORY_CARE_PROVIDER_SITE_OTHER): Payer: Managed Care, Other (non HMO) | Admitting: Family Medicine

## 2017-01-28 VITALS — BP 120/74 | HR 78 | Temp 98.2°F | Ht 63.0 in | Wt 134.0 lb

## 2017-01-28 DIAGNOSIS — E559 Vitamin D deficiency, unspecified: Secondary | ICD-10-CM | POA: Diagnosis not present

## 2017-01-28 DIAGNOSIS — Z0000 Encounter for general adult medical examination without abnormal findings: Secondary | ICD-10-CM

## 2017-01-28 DIAGNOSIS — Z780 Asymptomatic menopausal state: Secondary | ICD-10-CM

## 2017-01-28 DIAGNOSIS — E785 Hyperlipidemia, unspecified: Secondary | ICD-10-CM | POA: Diagnosis not present

## 2017-01-28 DIAGNOSIS — Z1211 Encounter for screening for malignant neoplasm of colon: Secondary | ICD-10-CM | POA: Diagnosis not present

## 2017-01-28 NOTE — Assessment & Plan Note (Signed)
Compliant with 1000 IU daily. Continue.

## 2017-01-28 NOTE — Progress Notes (Signed)
BP 120/74 (BP Location: Left Arm, Patient Position: Sitting, Cuff Size: Normal)   Pulse 78   Temp 98.2 F (36.8 C) (Oral)   Ht '5\' 3"'  (1.6 m)   Wt 134 lb (60.8 kg)   SpO2 98%   BMI 23.74 kg/m    CC: CPE Subjective:    Patient ID: Kathy Pugh, female    DOB: 1960-11-28, 57 y.o.   MRN: [id number]  HPI: Kathy Pugh is a 57 y.o. female presenting on 01/28/2017 for Annual Exam   10+ lb weight loss through healthy diet and lifestyle changes! Increased chicken, fish, lean meat, vegetables, salads, and fruits. Walking more. Very sustainable changes and very motivated to continue.   Came off hormones this year (HRT for 13 yrs). Doing well.   Preventative: COLONOSCOPY Date: 01/2004 normal Kathy Pugh). Discussed. Would like to do stool kit - did not complete last year, brought today.  Mammogram - WNL 04/2016 at breast center. Does SBE at home.  Well woman exam - paps always normal. Mom with ?h/o uterine cancer, abnormal cells that led to hysterectomy. Given fm hx requests q2 yrs. Will be due 2020.  Bone density - reviewed and discussed cal, vit D in diet, and weight bearing exercise Flu shot at work.  Tdap 11/2012 Menopause ~2003 at age 49. On angeliq for hot flashes and vag dryness. On low dose.  Seat belt use discussed  Sunscreen use discussed. No suspicious moles on skin. 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 Yogurt for breakfast, 1 cup milk/day. Overall healthy.   Relevant past medical, surgical, family and social history reviewed and updated as indicated. Interim medical history since our last visit reviewed. Allergies and medications reviewed and updated. Outpatient Medications Prior to Visit  Medication Sig Dispense Refill  .  cetirizine (ZYRTEC) 10 MG tablet Take 10 mg by mouth daily.      . cholecalciferol (VITAMIN D) 1000 UNITS tablet Take 1,000 Units by mouth daily.    . COD LIVER OIL PO Take 1 capsule by mouth daily.    . Cyanocobalamin (VITAMIN B-12 SL) Place 1 tablet under the tongue daily. 1500 mcg    . Omega-3 Fatty Acids (OMEGA 3 500) 500 MG CAPS Take 1 capsule by mouth daily.    . vitamin C (ASCORBIC ACID) 500 MG tablet Take 500 mg by mouth daily.      . vitamin E (VITAMIN E) 400 UNIT capsule Take 400 Units by mouth daily.    . ANGELIQ 0.25-0.5 MG TABS TAKE 1 TABLET BY MOUTH EVERY OTHER DAY. 28 tablet 6  . Ginkgo Biloba 40 MG TABS Take 1 tablet by mouth daily.     No facility-administered medications prior to visit.      Per HPI unless specifically indicated in ROS section below Review of Systems  Constitutional: Negative for activity change, appetite change, chills, fatigue, fever and unexpected weight change.  HENT: Negative for hearing loss.   Eyes: Negative for visual disturbance.  Respiratory: Negative for cough, chest tightness, shortness of breath and wheezing.   Cardiovascular: Negative for chest pain, palpitations and leg swelling.  Gastrointestinal: Negative for abdominal distention, abdominal pain, blood in stool, constipation, diarrhea, nausea and vomiting.  Genitourinary: Negative for difficulty urinating and hematuria.  Musculoskeletal: Negative for arthralgias, myalgias and neck pain.  Skin: Negative for rash.  Neurological: Negative for dizziness, seizures, syncope and headaches.  Hematological: Negative for adenopathy. Does not bruise/bleed easily.  Psychiatric/Behavioral: Negative for dysphoric mood. The patient is not nervous/anxious.        Objective:    BP 120/74 (BP Location: Left Arm, Patient Position: Sitting, Cuff Size: Normal)   Pulse 78   Temp 98.2 F (36.8 C) (Oral)   Ht '5\' 3"'  (1.6 m)   Wt 134 lb (60.8 kg)   SpO2 98%   BMI 23.74 kg/m   Wt Readings from Last 3  Encounters:  01/28/17 134 lb (60.8 kg)  01/26/16 144 lb 8 oz (65.5 kg)  06/01/15 148 lb 8 oz (67.4 kg)    Physical Exam  Constitutional: She is oriented to person, place, and time. She appears well-developed and well-nourished. No distress.  HENT:  Head: Normocephalic and atraumatic.  Right Ear: Hearing, tympanic membrane, external ear and ear canal normal.  Left Ear: Hearing, tympanic membrane, external ear and ear canal normal.  Nose: Nose normal.  Mouth/Throat: Uvula is midline, oropharynx is clear and moist and mucous membranes are normal. No oropharyngeal exudate, posterior oropharyngeal edema or posterior oropharyngeal erythema.  Eyes: Conjunctivae and EOM are normal. Pupils are equal, round, and reactive to light. No scleral icterus.  Neck: Normal range of motion. Neck supple. No thyromegaly present.  Cardiovascular: Normal rate, regular rhythm, normal heart sounds and intact distal pulses.  No murmur heard. Pulses:      Radial pulses are 2+ on the right side, and 2+ on the left side.  Pulmonary/Chest: Effort normal and breath sounds normal. No respiratory distress. She has no wheezes. She has no rales.  Abdominal: Soft. Bowel sounds are normal. She exhibits no distension and no mass. There is no tenderness. There is no rebound and no guarding.  Musculoskeletal: Normal range of motion. She exhibits no edema.  Lymphadenopathy:    She has no cervical adenopathy.  Neurological: She is alert and oriented to person, place, and time.  CN grossly intact, station and gait intact  Skin: Skin is warm and dry. No rash noted.  Psychiatric: She has a normal mood and affect. Her behavior is normal. Judgment and thought content normal.  Nursing note and vitals reviewed.  Results for orders placed or performed in visit on 49/67/59  Basic metabolic panel  Result Value Ref Range   Sodium 140 135 - 145 mEq/L   Potassium 3.9 3.5 - 5.1 mEq/L   Chloride 104 96 - 112 mEq/L   CO2 30 19 - 32 mEq/L     Glucose, Bld 88 70 - 99 mg/dL   BUN 13 6 - 23 mg/dL   Creatinine, Ser 0.69 0.40 - 1.20 mg/dL   Calcium 9.4 8.4 - 10.5 mg/dL   GFR 92.65 >60.00 mL/min  VITAMIN D 25 Hydroxy (Vit-D Deficiency, Fractures)  Result Value Ref Range   VITD 63.24 30.00 - 100.00 ng/mL  Vitamin B12  Result Value Ref Range   Vitamin B-12 1,006 (H) 211 - 911 pg/mL  Lipid panel  Result Value Ref Range   Cholesterol 160 0 - 200 mg/dL   Triglycerides 55.0 0.0 - 149.0 mg/dL   HDL 54.10 >39.00 mg/dL   VLDL 11.0 0.0 - 40.0 mg/dL   LDL Cholesterol 95 0 - 99 mg/dL   Total CHOL/HDL Ratio 3    NonHDL 106.24       Assessment & Plan:   Problem  List Items Addressed This Visit    Healthcare maintenance - Primary    Preventative protocols reviewed and updated unless pt declined. Discussed healthy diet and lifestyle.       Hyperlipidemia    Chronic, stable off medication. congratulated on sustainable healthy diet and lifestyle changes.  The 10-year ASCVD risk score Mikey Bussing DC Brooke Bonito., et al., 2013) is: 2%   Values used to calculate the score:     Age: 41 years     Sex: Female     Is Non-Hispanic African American: No     Diabetic: No     Tobacco smoker: No     Systolic Blood Pressure: 341 mmHg     Is BP treated: No     HDL Cholesterol: 54.1 mg/dL     Total Cholesterol: 160 mg/dL       Postmenopausal    Menopause age 48yo. S/p 13 yrs HRT. Now off. Reviewed osteoporosis prevention strategies.       Vitamin D deficiency    Compliant with 1000 IU daily. Continue.        Other Visit Diagnoses    Special screening for malignant neoplasms, colon       Relevant Orders   Fecal occult blood, imunochemical       Follow up plan: Return in about 1 year (around 01/28/2018) for annual exam, prior fasting for blood work.  Ria Bush, MD

## 2017-01-28 NOTE — Patient Instructions (Addendum)
Pass by lab to pick up stool kit. Congratulations on sustainable healthy diet and lifestyle changes! Keep up the good work! You are doing well today. Return as needed or in 1 year for next physical. Health Maintenance, Female Adopting a healthy lifestyle and getting preventive care can go a long way to promote health and wellness. Talk with your health care provider about what schedule of regular examinations is right for you. This is a good chance for you to check in with your provider about disease prevention and staying healthy. In between checkups, there are plenty of things you can do on your own. Experts have done a lot of research about which lifestyle changes and preventive measures are most likely to keep you healthy. Ask your health care provider for more information. Weight and diet Eat a healthy diet  Be sure to include plenty of vegetables, fruits, low-fat dairy products, and lean protein.  Do not eat a lot of foods high in solid fats, added sugars, or salt.  Get regular exercise. This is one of the most important things you can do for your health. ? Most adults should exercise for at least 150 minutes each week. The exercise should increase your heart rate and make you sweat (moderate-intensity exercise). ? Most adults should also do strengthening exercises at least twice a week. This is in addition to the moderate-intensity exercise.  Maintain a healthy weight  Body mass index (BMI) is a measurement that can be used to identify possible weight problems. It estimates body fat based on height and weight. Your health care provider can help determine your BMI and help you achieve or maintain a healthy weight.  For females 65 years of age and older: ? A BMI below 18.5 is considered underweight. ? A BMI of 18.5 to 24.9 is normal. ? A BMI of 25 to 29.9 is considered overweight. ? A BMI of 30 and above is considered obese.  Watch levels of cholesterol and blood lipids  You should  start having your blood tested for lipids and cholesterol at 59 years of age, then have this test every 5 years.  You may need to have your cholesterol levels checked more often if: ? Your lipid or cholesterol levels are high. ? You are older than 59 years of age. ? You are at high risk for heart disease.  Cancer screening Lung Cancer  Lung cancer screening is recommended for adults 68-71 years old who are at high risk for lung cancer because of a history of smoking.  A yearly low-dose CT scan of the lungs is recommended for people who: ? Currently smoke. ? Have quit within the past 15 years. ? Have at least a 30-pack-year history of smoking. A pack year is smoking an average of one pack of cigarettes a day for 1 year.  Yearly screening should continue until it has been 15 years since you quit.  Yearly screening should stop if you develop a health problem that would prevent you from having lung cancer treatment.  Breast Cancer  Practice breast self-awareness. This means understanding how your breasts normally appear and feel.  It also means doing regular breast self-exams. Let your health care provider know about any changes, no matter how small.  If you are in your 20s or 30s, you should have a clinical breast exam (CBE) by a health care provider every 1-3 years as part of a regular health exam.  If you are 1 or older, have a CBE every  year. Also consider having a breast X-ray (mammogram) every year.  If you have a family history of breast cancer, talk to your health care provider about genetic screening.  If you are at high risk for breast cancer, talk to your health care provider about having an MRI and a mammogram every year.  Breast cancer gene (BRCA) assessment is recommended for women who have family members with BRCA-related cancers. BRCA-related cancers include: ? Breast. ? Ovarian. ? Tubal. ? Peritoneal cancers.  Results of the assessment will determine the need  for genetic counseling and BRCA1 and BRCA2 testing.  Cervical Cancer Your health care provider may recommend that you be screened regularly for cancer of the pelvic organs (ovaries, uterus, and vagina). This screening involves a pelvic examination, including checking for microscopic changes to the surface of your cervix (Pap test). You may be encouraged to have this screening done every 3 years, beginning at age 29.  For women ages 8-65, health care providers may recommend pelvic exams and Pap testing every 3 years, or they may recommend the Pap and pelvic exam, combined with testing for human papilloma virus (HPV), every 5 years. Some types of HPV increase your risk of cervical cancer. Testing for HPV may also be done on women of any age with unclear Pap test results.  Other health care providers may not recommend any screening for nonpregnant women who are considered low risk for pelvic cancer and who do not have symptoms. Ask your health care provider if a screening pelvic exam is right for you.  If you have had past treatment for cervical cancer or a condition that could lead to cancer, you need Pap tests and screening for cancer for at least 20 years after your treatment. If Pap tests have been discontinued, your risk factors (such as having a new sexual partner) need to be reassessed to determine if screening should resume. Some women have medical problems that increase the chance of getting cervical cancer. In these cases, your health care provider may recommend more frequent screening and Pap tests.  Colorectal Cancer  This type of cancer can be detected and often prevented.  Routine colorectal cancer screening usually begins at 59 years of age and continues through 59 years of age.  Your health care provider may recommend screening at an earlier age if you have risk factors for colon cancer.  Your health care provider may also recommend using home test kits to check for hidden blood in  the stool.  A small camera at the end of a tube can be used to examine your colon directly (sigmoidoscopy or colonoscopy). This is done to check for the earliest forms of colorectal cancer.  Routine screening usually begins at age 32.  Direct examination of the colon should be repeated every 5-10 years through 59 years of age. However, you may need to be screened more often if early forms of precancerous polyps or small growths are found.  Skin Cancer  Check your skin from head to toe regularly.  Tell your health care provider about any new moles or changes in moles, especially if there is a change in a mole's shape or color.  Also tell your health care provider if you have a mole that is larger than the size of a pencil eraser.  Always use sunscreen. Apply sunscreen liberally and repeatedly throughout the day.  Protect yourself by wearing long sleeves, pants, a wide-brimmed hat, and sunglasses whenever you are outside.  Heart disease, diabetes, and  high blood pressure  High blood pressure causes heart disease and increases the risk of stroke. High blood pressure is more likely to develop in: ? People who have blood pressure in the high end of the normal range (130-139/85-89 mm Hg). ? People who are overweight or obese. ? People who are African American.  If you are 42-20 years of age, have your blood pressure checked every 3-5 years. If you are 77 years of age or older, have your blood pressure checked every year. You should have your blood pressure measured twice-once when you are at a hospital or clinic, and once when you are not at a hospital or clinic. Record the average of the two measurements. To check your blood pressure when you are not at a hospital or clinic, you can use: ? An automated blood pressure machine at a pharmacy. ? A home blood pressure monitor.  If you are between 19 years and 65 years old, ask your health care provider if you should take aspirin to prevent  strokes.  Have regular diabetes screenings. This involves taking a blood sample to check your fasting blood sugar level. ? If you are at a normal weight and have a low risk for diabetes, have this test once every three years after 59 years of age. ? If you are overweight and have a high risk for diabetes, consider being tested at a younger age or more often. Preventing infection Hepatitis B  If you have a higher risk for hepatitis B, you should be screened for this virus. You are considered at high risk for hepatitis B if: ? You were born in a country where hepatitis B is common. Ask your health care provider which countries are considered high risk. ? Your parents were born in a high-risk country, and you have not been immunized against hepatitis B (hepatitis B vaccine). ? You have HIV or AIDS. ? You use needles to inject street drugs. ? You live with someone who has hepatitis B. ? You have had sex with someone who has hepatitis B. ? You get hemodialysis treatment. ? You take certain medicines for conditions, including cancer, organ transplantation, and autoimmune conditions.  Hepatitis C  Blood testing is recommended for: ? Everyone born from [birth year] through 1967. ? Anyone with known risk factors for hepatitis C.  Sexually transmitted infections (STIs)  You should be screened for sexually transmitted infections (STIs) including gonorrhea and chlamydia if: ? You are sexually active and are younger than 59 years of age. ? You are older than 59 years of age and your health care provider tells you that you are at risk for this type of infection. ? Your sexual activity has changed since you were last screened and you are at an increased risk for chlamydia or gonorrhea. Ask your health care provider if you are at risk.  If you do not have HIV, but are at risk, it may be recommended that you take a prescription medicine daily to prevent HIV infection. This is called pre-exposure prophylaxis  (PrEP). You are considered at risk if: ? You are sexually active and do not regularly use condoms or know the HIV status of your partner(s). ? You take drugs by injection. ? You are sexually active with a partner who has HIV.  Talk with your health care provider about whether you are at high risk of being infected with HIV. If you choose to begin PrEP, you should first be tested for HIV. You should then be  tested every 3 months for as long as you are taking PrEP. Pregnancy  If you are premenopausal and you may become pregnant, ask your health care provider about preconception counseling.  If you may become pregnant, take 400 to 800 micrograms (mcg) of folic acid every day.  If you want to prevent pregnancy, talk to your health care provider about birth control (contraception). Osteoporosis and menopause  Osteoporosis is a disease in which the bones lose minerals and strength with aging. This can result in serious bone fractures. Your risk for osteoporosis can be identified using a bone density scan.  If you are 65 years of age or older, or if you are at risk for osteoporosis and fractures, ask your health care provider if you should be screened.  Ask your health care provider whether you should take a calcium or vitamin D supplement to lower your risk for osteoporosis.  Menopause may have certain physical symptoms and risks.  Hormone replacement therapy may reduce some of these symptoms and risks. Talk to your health care provider about whether hormone replacement therapy is right for you. Follow these instructions at home:  Schedule regular health, dental, and eye exams.  Stay current with your immunizations.  Do not use any tobacco products including cigarettes, chewing tobacco, or electronic cigarettes.  If you are pregnant, do not drink alcohol.  If you are breastfeeding, limit how much and how often you drink alcohol.  Limit alcohol intake to no more than 1 drink per day for  nonpregnant women. One drink equals 12 ounces of beer, 5 ounces of wine, or 1 ounces of hard liquor.  Do not use street drugs.  Do not share needles.  Ask your health care provider for help if you need support or information about quitting drugs.  Tell your health care provider if you often feel depressed.  Tell your health care provider if you have ever been abused or do not feel safe at home. This information is not intended to replace advice given to you by your health care provider. Make sure you discuss any questions you have with your health care provider. Document Released: 07/03/2010 Document Revised: 05/26/2015 Document Reviewed: 09/21/2014 Elsevier Interactive Patient Education  2018 [employer]  

## 2017-01-28 NOTE — Assessment & Plan Note (Signed)
Preventative protocols reviewed and updated unless pt declined. Discussed healthy diet and lifestyle.  

## 2017-01-28 NOTE — Assessment & Plan Note (Signed)
Menopause age 57yo. S/p 13 yrs HRT. Now off. Reviewed osteoporosis prevention strategies.

## 2017-01-28 NOTE — Assessment & Plan Note (Signed)
Chronic, stable off medication. congratulated on sustainable healthy diet and lifestyle changes.  The 10-year ASCVD risk score Kathy Pugh., et al., 2013) is: 2%   Values used to calculate the score:     Age: 57 years     Sex: Female     Is Non-Hispanic African American: No     Diabetic: No     Tobacco smoker: No     Systolic Blood Pressure: 528 mmHg     Is BP treated: No     HDL Cholesterol: 54.1 mg/dL     Total Cholesterol: 160 mg/dL

## 2017-03-18 ENCOUNTER — Other Ambulatory Visit: Payer: Self-pay | Admitting: Family Medicine

## 2017-03-18 DIAGNOSIS — Z1231 Encounter for screening mammogram for malignant neoplasm of breast: Secondary | ICD-10-CM

## 2017-04-22 ENCOUNTER — Ambulatory Visit
Admission: RE | Admit: 2017-04-22 | Discharge: 2017-04-22 | Disposition: A | Discharge status: Home / Self Care | Payer: Managed Care, Other (non HMO) | Source: Ambulatory Visit | Attending: Family Medicine | Admitting: Family Medicine

## 2017-04-22 DIAGNOSIS — Z1231 Encounter for screening mammogram for malignant neoplasm of breast: Secondary | ICD-10-CM

## 2017-04-23 LAB — HM MAMMOGRAPHY

## 2017-04-24 ENCOUNTER — Encounter: Payer: Self-pay | Admitting: Family Medicine

## 2017-11-05 ENCOUNTER — Encounter: Payer: Self-pay | Admitting: Family Medicine

## 2017-11-05 ENCOUNTER — Ambulatory Visit: Payer: Managed Care, Other (non HMO) | Admitting: Family Medicine

## 2017-11-05 VITALS — BP 110/64 | HR 87 | Temp 98.7°F | Ht 63.0 in | Wt 138.8 lb

## 2017-11-05 DIAGNOSIS — R300 Dysuria: Secondary | ICD-10-CM | POA: Diagnosis not present

## 2017-11-05 LAB — POC URINALSYSI DIPSTICK (AUTOMATED)
BILIRUBIN UA: NEGATIVE
Glucose, UA: NEGATIVE
KETONES UA: NEGATIVE
Nitrite, UA: NEGATIVE
PH UA: 6.5 (ref 5.0–8.0)
Protein, UA: NEGATIVE
Urobilinogen, UA: 0.2 E.U./dL

## 2017-11-05 MED ORDER — CEPHALEXIN 500 MG PO CAPS: 500.0000 mg | ORAL_CAPSULE | Freq: Two times a day (BID) | ORAL | 0 refills | Status: DC

## 2017-11-05 NOTE — Progress Notes (Signed)
Dysuria: yes, slow stream as of this AM. abd pain radiating up the torso.  Pain with urination.  duration of symptoms: today only abdominal pain: no pain unless sitting fevers:no back pain:no Vomiting: some nausea but not vomiting.    She has had episodic dysuria after intercourse.   This feels like prev UTIs.   She just started AZO today.    Meds, vitals, and allergies reviewed.   Per HPI unless specifically indicated in ROS section   GEN: nad, alert and oriented HEENT: mucous membranes moist NECK: supple CV: rrr.  PULM: ctab, no inc wob ABD: soft, +bs, suprapubic area tender EXT: no edema SKIN: well perfused.   BACK: no CVA pain

## 2017-11-05 NOTE — Patient Instructions (Signed)
Drink plenty of water and start the antibiotics today.  We'll contact you with your lab report.  Take care.    Let me ask Dr. Darnell Level about prophylactic antibiotics.

## 2017-11-06 DIAGNOSIS — R300 Dysuria: Secondary | ICD-10-CM | POA: Insufficient documentation

## 2017-11-06 LAB — URINE CULTURE
MICRO NUMBER: 91330674
RESULT: NO GROWTH
SPECIMEN QUALITY: ADEQUATE

## 2017-11-06 NOTE — Assessment & Plan Note (Signed)
Likely cystitis.  Nontoxic.  Okay for outpatient follow-up.  Start Keflex.  Check urine culture.  It is possible that we may need to change antibiotics based on culture report, discussed with patient.  Briefly discussed prophylactic antibiotics around the time of intercourse.  I will defer to PCP on this.  It may make sense to await the urine culture before starting that.

## 2017-11-15 ENCOUNTER — Telehealth: Payer: Self-pay | Admitting: Family Medicine

## 2017-11-15 MED ORDER — NITROFURANTOIN MONOHYD MACRO 100 MG PO CAPS: 100.0000 mg | ORAL_CAPSULE | Freq: Every day | ORAL | 0 refills | Status: DC | PRN

## 2017-11-15 NOTE — Telephone Encounter (Signed)
Pt returning call to [place]. Please call pt

## 2017-11-15 NOTE — Telephone Encounter (Signed)
Patient notified as instructed by telephone and verbalized understanding. 

## 2017-11-15 NOTE — Telephone Encounter (Signed)
Call patient.  I talked with Dr. Darnell Level in the meantime.  Reasonable to try Macrobid as prophylactic therapy for UTIs.  Take 1 tablet after intercourse.  Do not take it otherwise.  Drink plenty of fluids.  See if that helps with dysuria.  Update [place] as needed.  Prescription sent for 10 pills for her to try this and see how she does. Thanks.

## 2017-11-15 NOTE — Telephone Encounter (Signed)
Left message on voicemail for patient to call back. 

## 2018-01-23 ENCOUNTER — Other Ambulatory Visit: Payer: Self-pay | Admitting: Family Medicine

## 2018-01-23 DIAGNOSIS — E785 Hyperlipidemia, unspecified: Secondary | ICD-10-CM

## 2018-01-23 DIAGNOSIS — E559 Vitamin D deficiency, unspecified: Secondary | ICD-10-CM

## 2018-01-24 ENCOUNTER — Other Ambulatory Visit (INDEPENDENT_AMBULATORY_CARE_PROVIDER_SITE_OTHER): Payer: Managed Care, Other (non HMO)

## 2018-01-24 DIAGNOSIS — E559 Vitamin D deficiency, unspecified: Secondary | ICD-10-CM | POA: Diagnosis not present

## 2018-01-24 DIAGNOSIS — Z1211 Encounter for screening for malignant neoplasm of colon: Secondary | ICD-10-CM

## 2018-01-24 DIAGNOSIS — E785 Hyperlipidemia, unspecified: Secondary | ICD-10-CM | POA: Diagnosis not present

## 2018-01-24 LAB — LIPID PANEL
Cholesterol: 164 mg/dL (ref 0–200)
HDL: 56.8 mg/dL (ref >39.00)
LDL Cholesterol: 97 mg/dL (ref 0–99)
NONHDL: 107.6
Total CHOL/HDL Ratio: 3
Triglycerides: 53 mg/dL (ref 0.0–149.0)
VLDL: 10.6 mg/dL (ref 0.0–40.0)

## 2018-01-24 LAB — BASIC METABOLIC PANEL
BUN: 12 mg/dL (ref 6–23)
CALCIUM: 9.9 mg/dL (ref 8.4–10.5)
CO2: 28 mEq/L (ref 19–32)
Chloride: 103 mEq/L (ref 96–112)
Creatinine, Ser: 0.73 mg/dL (ref 0.40–1.20)
GFR: 81.4 mL/min (ref >60.00)
GLUCOSE: 86 mg/dL (ref 70–99)
Potassium: 3.9 mEq/L (ref 3.5–5.1)
Sodium: 139 mEq/L (ref 135–145)

## 2018-01-24 LAB — HEPATIC FUNCTION PANEL
ALK PHOS: 71 U/L (ref 39–117)
ALT: 14 U/L (ref 0–35)
AST: 18 U/L (ref 0–37)
Albumin: 4.7 g/dL (ref 3.5–5.2)
BILIRUBIN DIRECT: 0.1 mg/dL (ref 0.0–0.3)
TOTAL PROTEIN: 7.3 g/dL (ref 6.0–8.3)
Total Bilirubin: 0.4 mg/dL (ref 0.2–1.2)

## 2018-01-24 LAB — VITAMIN D 25 HYDROXY (VIT D DEFICIENCY, FRACTURES): VITD: 52.62 ng/mL (ref 30.00–100.00)

## 2018-01-28 ENCOUNTER — Other Ambulatory Visit: Payer: Managed Care, Other (non HMO)

## 2018-01-29 ENCOUNTER — Other Ambulatory Visit: Payer: Self-pay | Admitting: Family Medicine

## 2018-01-29 ENCOUNTER — Other Ambulatory Visit (INDEPENDENT_AMBULATORY_CARE_PROVIDER_SITE_OTHER): Payer: Managed Care, Other (non HMO)

## 2018-01-29 ENCOUNTER — Encounter: Payer: Self-pay | Admitting: Family Medicine

## 2018-01-29 DIAGNOSIS — Z1211 Encounter for screening for malignant neoplasm of colon: Secondary | ICD-10-CM

## 2018-01-29 LAB — FECAL OCCULT BLOOD, IMMUNOCHEMICAL: Fecal Occult Bld: NEGATIVE

## 2018-01-29 LAB — FECAL OCCULT BLOOD, GUAIAC: Fecal Occult Blood: NEGATIVE

## 2018-01-30 NOTE — Progress Notes (Addendum)
BP 118/68 (BP Location: Left Arm, Patient Position: Sitting, Cuff Size: Normal)   Pulse 65   Temp 98.2 F (36.8 C) (Oral)   Ht 5' 3.25" (1.607 m)   Wt 135 lb 8 oz (61.5 kg)   SpO2 100%   BMI 23.81 kg/m    CC: CPE Subjective:    Patient ID: Kathy Pugh, female    DOB: 1960/12/23, 58 y.o.   MRN: [id number]  HPI: Kathy Pugh is a 58 y.o. female presenting on 01/31/2018 for Annual Exam   Recently lost SIL [facility]).  Came off hormones 2018 (HRT for 13 yrs).  H/o recurrent UTI - macrobid postcoitally 1 tablet has been beneficial. Has not been on topical estrogen before.   Currently with dx shingles by [facility] - patch L lateral thigh present over a week. No pain or itching. Never vesicular. She did have some L upper abd soreness into flank that has since resolved. This has been associated with marked fatigue. Seen at [facility] on Friday, currently on keflex and prednisone course - too late to start antiviral. Treating with topical benadryl, cortisone-10, and peroxide. Symptoms are improving.   Preventative: COLONOSCOPY Date: 01/2004 normal [name]). Stool kits yearly since, normal. Mammogram -WNL4/2019 at breast center. Does SBE at home without concerns.  Well woman exam - paps always normal. Mom with ?h/o uterine cancer, abnormal cells that led to hysterectomy. Given fm hx requests q2 yrs. Due this year 2020.  Bone density - reviewed and discussed cal, vit D in diet, and weight bearing exercise Flu shot at work.  Tdap 11/2012 shingrix - interested, but likely immune for 1 yr after recent shingles infection Menopause~2003 at age 75. Off angeliq since 2018. Was on for hot flashes and vag dryness. Seat belt use discussed  Sunscreen use discussed. No suspicious moles on skin. 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 Yogurt for breakfast, 1 cup milk/day.Overall healthy.     Relevant past medical, surgical, family and social history reviewed and updated as indicated. Interim medical history since our last visit reviewed. Allergies and medications reviewed and updated. Outpatient Medications Prior to Visit  Medication Sig Dispense Refill  . cephALEXin (KEFLEX) 250 MG capsule Take 1 capsule by mouth 2 (two) times daily. Take for 7 days    . cetirizine (ZYRTEC) 10 MG tablet Take 10 mg by mouth daily.      . cholecalciferol (VITAMIN D) 1000 UNITS tablet Take 1,000 Units by mouth daily.    . COD LIVER OIL PO Take 1 capsule by mouth daily.    . Cyanocobalamin (VITAMIN B-12 SL) Place 1 tablet under the tongue daily. 1500 mcg    . Omega-3 Fatty Acids (OMEGA 3 500) 500 MG CAPS Take 1 capsule by mouth daily.    . predniSONE (DELTASONE) 20 MG tablet Take 1 tablet by mouth. Take 4 days twice a day, THEN 4 days once a day    . vitamin C (ASCORBIC ACID) 500 MG tablet Take 500 mg by mouth daily.      . vitamin E (VITAMIN E) 400 UNIT capsule Take 400 Units by mouth daily.    . cephALEXin (KEFLEX) 500 MG capsule Take 1 capsule (500 mg total) by  mouth 2 (two) times daily. 14 capsule 0  . nitrofurantoin, macrocrystal-monohydrate, (MACROBID) 100 MG capsule Take 1 capsule (100 mg total) by mouth daily as needed. 10 capsule 0   No facility-administered medications prior to visit.      Per HPI unless specifically indicated in ROS section below Review of Systems  Constitutional: Positive for fatigue (shingles related). Negative for activity change, appetite change, chills, fever and unexpected weight change.  HENT: Negative for hearing loss.   Eyes: Negative for visual disturbance.  Respiratory: Negative for cough, chest tightness, shortness of breath and wheezing.   Cardiovascular: Negative for chest pain, palpitations  and leg swelling.  Gastrointestinal: Negative for abdominal distention, abdominal pain, blood in stool, constipation, diarrhea, nausea and vomiting.  Genitourinary: Negative for difficulty urinating and hematuria.  Musculoskeletal: Negative for arthralgias, myalgias and neck pain.  Skin: Negative for rash.  Neurological: Negative for dizziness, seizures, syncope and headaches.  Hematological: Negative for adenopathy. Does not bruise/bleed easily.  Psychiatric/Behavioral: Negative for dysphoric mood. The patient is not nervous/anxious.    Objective:    BP 118/68 (BP Location: Left Arm, Patient Position: Sitting, Cuff Size: Normal)   Pulse 65   Temp 98.2 F (36.8 C) (Oral)   Ht 5' 3.25" (1.607 m)   Wt 135 lb 8 oz (61.5 kg)   SpO2 100%   BMI 23.81 kg/m   Wt Readings from Last 3 Encounters:  01/31/18 135 lb 8 oz (61.5 kg)  11/05/17 138 lb 12 oz (62.9 kg)  01/28/17 134 lb (60.8 kg)    Physical Exam Vitals signs and nursing note reviewed. Exam conducted with a chaperone present.  Constitutional:      General: She is not in acute distress.    Appearance: Normal appearance. She is well-developed.  HENT:     Head: Normocephalic and atraumatic.     Right Ear: Hearing, tympanic membrane, ear canal and external ear normal.     Left Ear: Hearing, tympanic membrane, ear canal and external ear normal.     Nose: Nose normal.     Mouth/Throat:     Mouth: Mucous membranes are moist.     Pharynx: Uvula midline. No oropharyngeal exudate or posterior oropharyngeal erythema.  Eyes:     General: No scleral icterus.    Conjunctiva/sclera: Conjunctivae normal.     Pupils: Pupils are equal, round, and reactive to light.  Neck:     Musculoskeletal: Normal range of motion and neck supple.  Cardiovascular:     Rate and Rhythm: Normal rate and regular rhythm.     Pulses: Normal pulses.          Radial pulses are 2+ on the right side and 2+ on the left side.     Heart sounds: Normal heart sounds. No  murmur.  Pulmonary:     Effort: Pulmonary effort is normal. No respiratory distress.     Breath sounds: Normal breath sounds. No wheezing, rhonchi or rales.  Chest:     Breasts: Breasts are symmetrical.        Right: Normal. No swelling, bleeding, inverted nipple, mass, nipple discharge, skin change or tenderness.        Left: Normal. No swelling, bleeding, inverted nipple, mass, nipple discharge, skin change or tenderness.  Abdominal:     General: Bowel sounds are normal. There is no distension.     Palpations: Abdomen is soft. There is no mass.     Tenderness: There is no abdominal tenderness. There is no guarding or  rebound.  Genitourinary:    General: Normal vulva.     Exam position: Supine.     Labia:        Right: No rash or tenderness.        Left: No rash or tenderness.      Vagina: Normal.     Cervix: Normal.     Uterus: Normal.      Adnexa: Right adnexa normal and left adnexa normal.     Comments: Pap performed on cervix Musculoskeletal: Normal range of motion.  Lymphadenopathy:     Head:     Right side of head: No submental, submandibular, tonsillar, preauricular or posterior auricular adenopathy.     Left side of head: No submental, submandibular, tonsillar, preauricular or posterior auricular adenopathy.     Cervical: No cervical adenopathy.     Upper Body:     Right upper body: No supraclavicular or axillary adenopathy.     Left upper body: No supraclavicular or axillary adenopathy.  Skin:    General: Skin is warm and dry.     Findings: No rash.  Neurological:     Mental Status: She is alert and oriented to person, place, and time.     Comments: CN grossly intact, station and gait intact  Psychiatric:        Behavior: Behavior normal.        Thought Content: Thought content normal.        Judgment: Judgment normal.       Results for orders placed or performed in visit on 01/29/18  Fecal Occult Blood, Guaiac  Result Value Ref Range   Fecal Occult Blood  Negative    Assessment & Plan:   Problem List Items Addressed This Visit    Vitamin D deficiency    Continues vit D daily.       Skin rash    Recent dx at [facility] of shingles treating with keflex/prednisone (was too late to start valtrex). Rash slowly resolving. Story/symptoms not quite consistent with shingles, will recommend shingrix next year.       Recurrent UTI    Doing well with macrobid postcoitally. Discussed possible vaginal estrogen option - will just continue macrobid for now.  Takes pill about 8x/month.       Relevant Medications   cephALEXin (KEFLEX) 250 MG capsule   nitrofurantoin, macrocrystal-monohydrate, (MACROBID) 100 MG capsule   Postmenopausal    Took angeliq (oral HRT) for 13 yrs, stopped 2018. Some vaginal dryness symptoms using lubrication.       Hyperlipidemia    Chronic, stable off medication. The 10-year ASCVD risk score Mikey Bussing DC Brooke Bonito., et al., 2013) is: 2.1%   Values used to calculate the score:     Age: 19 years     Sex: Female     Is Non-Hispanic African American: No     Diabetic: No     Tobacco smoker: No     Systolic Blood Pressure: 188 mmHg     Is BP treated: No     HDL Cholesterol: 56.8 mg/dL     Total Cholesterol: 164 mg/dL       Healthcare maintenance - Primary    Preventative protocols reviewed and updated unless pt declined. Discussed healthy diet and lifestyle.        Other Visit Diagnoses    Encounter for annual routine gynecological examination       Relevant Orders   Cytology - PAP       Meds ordered this encounter  Medications  . nitrofurantoin, macrocrystal-monohydrate, (MACROBID) 100 MG capsule    Sig: Take 1 capsule (100 mg total) by mouth daily as needed.    Dispense:  10 capsule    Refill:  6   No orders of the defined types were placed in this encounter.   Follow up plan: Return in about 1 year (around 02/01/2019) for annual exam, prior fasting for blood work.  Ria Bush, MD

## 2018-01-31 ENCOUNTER — Other Ambulatory Visit (HOSPITAL_COMMUNITY)
Admission: RE | Admit: 2018-01-31 | Discharge: 2018-01-31 | Disposition: A | Discharge status: Home / Self Care | Payer: Managed Care, Other (non HMO) | Source: Ambulatory Visit | Attending: Family Medicine | Admitting: Family Medicine

## 2018-01-31 ENCOUNTER — Encounter: Payer: Self-pay | Admitting: Family Medicine

## 2018-01-31 ENCOUNTER — Ambulatory Visit (INDEPENDENT_AMBULATORY_CARE_PROVIDER_SITE_OTHER): Payer: Managed Care, Other (non HMO) | Admitting: Family Medicine

## 2018-01-31 VITALS — BP 118/68 | HR 65 | Temp 98.2°F | Ht 63.25 in | Wt 135.5 lb

## 2018-01-31 DIAGNOSIS — Z0000 Encounter for general adult medical examination without abnormal findings: Secondary | ICD-10-CM

## 2018-01-31 DIAGNOSIS — Z780 Asymptomatic menopausal state: Secondary | ICD-10-CM | POA: Diagnosis not present

## 2018-01-31 DIAGNOSIS — Z01419 Encounter for gynecological examination (general) (routine) without abnormal findings: Secondary | ICD-10-CM

## 2018-01-31 DIAGNOSIS — N390 Urinary tract infection, site not specified: Secondary | ICD-10-CM

## 2018-01-31 DIAGNOSIS — R21 Rash and other nonspecific skin eruption: Secondary | ICD-10-CM

## 2018-01-31 DIAGNOSIS — E559 Vitamin D deficiency, unspecified: Secondary | ICD-10-CM | POA: Diagnosis not present

## 2018-01-31 DIAGNOSIS — E785 Hyperlipidemia, unspecified: Secondary | ICD-10-CM

## 2018-01-31 MED ORDER — NITROFURANTOIN MONOHYD MACRO 100 MG PO CAPS: 100.0000 mg | ORAL_CAPSULE | Freq: Every day | ORAL | 6 refills | Status: DC | PRN

## 2018-01-31 NOTE — Patient Instructions (Signed)
Continue macrobid as needed for UTI prevention You are doing well today Return as needed or in 1 year for next physical. Health Maintenance, Female Adopting a healthy lifestyle and getting preventive care can go a long way to promote health and wellness. Talk with your health care provider about what schedule of regular examinations is right for you. This is a good chance for you to check in with your provider about disease prevention and staying healthy. In between checkups, there are plenty of things you can do on your own. Experts have done a lot of research about which lifestyle changes and preventive measures are most likely to keep you healthy. Ask your health care provider for more information. Weight and diet Eat a healthy diet  Be sure to include plenty of vegetables, fruits, low-fat dairy products, and lean protein.  Do not eat a lot of foods high in solid fats, added sugars, or salt.  Get regular exercise. This is one of the most important things you can do for your health. ? Most adults should exercise for at least 150 minutes each week. The exercise should increase your heart rate and make you sweat (moderate-intensity exercise). ? Most adults should also do strengthening exercises at least twice a week. This is in addition to the moderate-intensity exercise. Maintain a healthy weight  Body mass index (BMI) is a measurement that can be used to identify possible weight problems. It estimates body fat based on height and weight. Your health care provider can help determine your BMI and help you achieve or maintain a healthy weight.  For females 45 years of age and older: ? A BMI below 18.5 is considered underweight. ? A BMI of 18.5 to 24.9 is normal. ? A BMI of 25 to 29.9 is considered overweight. ? A BMI of 30 and above is considered obese. Watch levels of cholesterol and blood lipids  You should start having your blood tested for lipids and cholesterol at 60 years of age, then  have this test every 5 years.  You may need to have your cholesterol levels checked more often if: ? Your lipid or cholesterol levels are high. ? You are older than 60 years of age. ? You are at high risk for heart disease. Cancer screening Lung Cancer  Lung cancer screening is recommended for adults 75-55 years old who are at high risk for lung cancer because of a history of smoking.  A yearly low-dose CT scan of the lungs is recommended for people who: ? Currently smoke. ? Have quit within the past 15 years. ? Have at least a 30-pack-year history of smoking. A pack year is smoking an average of one pack of cigarettes a day for 1 year.  Yearly screening should continue until it has been 15 years since you quit.  Yearly screening should stop if you develop a health problem that would prevent you from having lung cancer treatment. Breast Cancer  Practice breast self-awareness. This means understanding how your breasts normally appear and feel.  It also means doing regular breast self-exams. Let your health care provider know about any changes, no matter how small.  If you are in your 20s or 30s, you should have a clinical breast exam (CBE) by a health care provider every 1-3 years as part of a regular health exam.  If you are 27 or older, have a CBE every year. Also consider having a breast X-ray (mammogram) every year.  If you have a family history of  breast cancer, talk to your health care provider about genetic screening.  If you are at high risk for breast cancer, talk to your health care provider about having an MRI and a mammogram every year.  Breast cancer gene (BRCA) assessment is recommended for women who have family members with BRCA-related cancers. BRCA-related cancers include: ? Breast. ? Ovarian. ? Tubal. ? Peritoneal cancers.  Results of the assessment will determine the need for genetic counseling and BRCA1 and BRCA2 testing. Cervical Cancer Your health care  provider may recommend that you be screened regularly for cancer of the pelvic organs (ovaries, uterus, and vagina). This screening involves a pelvic examination, including checking for microscopic changes to the surface of your cervix (Pap test). You may be encouraged to have this screening done every 3 years, beginning at age 21.  For women ages 80-65, health care providers may recommend pelvic exams and Pap testing every 3 years, or they may recommend the Pap and pelvic exam, combined with testing for human papilloma virus (HPV), every 5 years. Some types of HPV increase your risk of cervical cancer. Testing for HPV may also be done on women of any age with unclear Pap test results.  Other health care providers may not recommend any screening for nonpregnant women who are considered low risk for pelvic cancer and who do not have symptoms. Ask your health care provider if a screening pelvic exam is right for you.  If you have had past treatment for cervical cancer or a condition that could lead to cancer, you need Pap tests and screening for cancer for at least 20 years after your treatment. If Pap tests have been discontinued, your risk factors (such as having a new sexual partner) need to be reassessed to determine if screening should resume. Some women have medical problems that increase the chance of getting cervical cancer. In these cases, your health care provider may recommend more frequent screening and Pap tests. Colorectal Cancer  This type of cancer can be detected and often prevented.  Routine colorectal cancer screening usually begins at 60 years of age and continues through 60 years of age.  Your health care provider may recommend screening at an earlier age if you have risk factors for colon cancer.  Your health care provider may also recommend using home test kits to check for hidden blood in the stool.  A small camera at the end of a tube can be used to examine your colon directly  (sigmoidoscopy or colonoscopy). This is done to check for the earliest forms of colorectal cancer.  Routine screening usually begins at age 33.  Direct examination of the colon should be repeated every 5-10 years through 60 years of age. However, you may need to be screened more often if early forms of precancerous polyps or small growths are found. Skin Cancer  Check your skin from head to toe regularly.  Tell your health care provider about any new moles or changes in moles, especially if there is a change in a mole's shape or color.  Also tell your health care provider if you have a mole that is larger than the size of a pencil eraser.  Always use sunscreen. Apply sunscreen liberally and repeatedly throughout the day.  Protect yourself by wearing long sleeves, pants, a wide-brimmed hat, and sunglasses whenever you are outside. Heart disease, diabetes, and high blood pressure  High blood pressure causes heart disease and increases the risk of stroke. High blood pressure is more likely  to develop in: ? People who have blood pressure in the high end of the normal range (130-139/85-89 mm Hg). ? People who are overweight or obese. ? People who are African American.  If you are 59-12 years of age, have your blood pressure checked every 3-5 years. If you are 24 years of age or older, have your blood pressure checked every year. You should have your blood pressure measured twice-once when you are at a hospital or clinic, and once when you are not at a hospital or clinic. Record the average of the two measurements. To check your blood pressure when you are not at a hospital or clinic, you can use: ? An automated blood pressure machine at a pharmacy. ? A home blood pressure monitor.  If you are between 31 years and 38 years old, ask your health care provider if you should take aspirin to prevent strokes.  Have regular diabetes screenings. This involves taking a blood sample to check your  fasting blood sugar level. ? If you are at a normal weight and have a low risk for diabetes, have this test once every three years after 60 years of age. ? If you are overweight and have a high risk for diabetes, consider being tested at a younger age or more often. Preventing infection Hepatitis B  If you have a higher risk for hepatitis B, you should be screened for this virus. You are considered at high risk for hepatitis B if: ? You were born in a country where hepatitis B is common. Ask your health care provider which countries are considered high risk. ? Your parents were born in a high-risk country, and you have not been immunized against hepatitis B (hepatitis B vaccine). ? You have HIV or AIDS. ? You use needles to inject street drugs. ? You live with someone who has hepatitis B. ? You have had sex with someone who has hepatitis B. ? You get hemodialysis treatment. ? You take certain medicines for conditions, including cancer, organ transplantation, and autoimmune conditions. Hepatitis C  Blood testing is recommended for: ? Everyone born from [birth year] through 1967. ? Anyone with known risk factors for hepatitis C. Sexually transmitted infections (STIs)  You should be screened for sexually transmitted infections (STIs) including gonorrhea and chlamydia if: ? You are sexually active and are younger than 60 years of age. ? You are older than 60 years of age and your health care provider tells you that you are at risk for this type of infection. ? Your sexual activity has changed since you were last screened and you are at an increased risk for chlamydia or gonorrhea. Ask your health care provider if you are at risk.  If you do not have HIV, but are at risk, it may be recommended that you take a prescription medicine daily to prevent HIV infection. This is called pre-exposure prophylaxis (PrEP). You are considered at risk if: ? You are sexually active and do not regularly use condoms or  know the HIV status of your partner(s). ? You take drugs by injection. ? You are sexually active with a partner who has HIV. Talk with your health care provider about whether you are at high risk of being infected with HIV. If you choose to begin PrEP, you should first be tested for HIV. You should then be tested every 3 months for as long as you are taking PrEP. Pregnancy  If you are premenopausal and you may become pregnant, ask your  health care provider about preconception counseling.  If you may become pregnant, take 400 to 800 micrograms (mcg) of folic acid every day.  If you want to prevent pregnancy, talk to your health care provider about birth control (contraception). Osteoporosis and menopause  Osteoporosis is a disease in which the bones lose minerals and strength with aging. This can result in serious bone fractures. Your risk for osteoporosis can be identified using a bone density scan.  If you are 22 years of age or older, or if you are at risk for osteoporosis and fractures, ask your health care provider if you should be screened.  Ask your health care provider whether you should take a calcium or vitamin D supplement to lower your risk for osteoporosis.  Menopause may have certain physical symptoms and risks.  Hormone replacement therapy may reduce some of these symptoms and risks. Talk to your health care provider about whether hormone replacement therapy is right for you. Follow these instructions at home:  Schedule regular health, dental, and eye exams.  Stay current with your immunizations.  Do not use any tobacco products including cigarettes, chewing tobacco, or electronic cigarettes.  If you are pregnant, do not drink alcohol.  If you are breastfeeding, limit how much and how often you drink alcohol.  Limit alcohol intake to no more than 1 drink per day for nonpregnant women. One drink equals 12 ounces of beer, 5 ounces of wine, or 1 ounces of hard  liquor.  Do not use street drugs.  Do not share needles.  Ask your health care provider for help if you need support or information about quitting drugs.  Tell your health care provider if you often feel depressed.  Tell your health care provider if you have ever been abused or do not feel safe at home. This information is not intended to replace advice given to you by your health care provider. Make sure you discuss any questions you have with your health care provider. Document Released: 07/03/2010 Document Revised: 05/26/2015 Document Reviewed: 09/21/2014 Elsevier Interactive Patient Education  2019 [employer].

## 2018-01-31 NOTE — Assessment & Plan Note (Signed)
Preventative protocols reviewed and updated unless pt declined. Discussed healthy diet and lifestyle.  

## 2018-02-01 DIAGNOSIS — R21 Rash and other nonspecific skin eruption: Secondary | ICD-10-CM | POA: Insufficient documentation

## 2018-02-01 NOTE — Assessment & Plan Note (Addendum)
Continues vit D daily.

## 2018-02-01 NOTE — Assessment & Plan Note (Addendum)
Doing well with macrobid postcoitally. Discussed possible vaginal estrogen option - will just continue macrobid for now.  Takes pill about 8x/month.

## 2018-02-01 NOTE — Assessment & Plan Note (Addendum)
Took angeliq (oral HRT) for 13 yrs, stopped 2018. Some vaginal dryness symptoms using lubrication.

## 2018-02-01 NOTE — Assessment & Plan Note (Signed)
Recent dx at [facility] of shingles treating with keflex/prednisone (was too late to start valtrex). Rash slowly resolving. Story/symptoms not quite consistent with shingles, will recommend shingrix next year.

## 2018-02-01 NOTE — Assessment & Plan Note (Signed)
Chronic, stable off medication. The 10-year ASCVD risk score Mikey Bussing DC Brooke Bonito., et al., 2013) is: 2.1%   Values used to calculate the score:     Age: 58 years     Sex: Female     Is Non-Hispanic African American: No     Diabetic: No     Tobacco smoker: No     Systolic Blood Pressure: 175 mmHg     Is BP treated: No     HDL Cholesterol: 56.8 mg/dL     Total Cholesterol: 164 mg/dL

## 2018-02-03 LAB — CYTOLOGY - PAP
Diagnosis: NEGATIVE
HPV: NOT DETECTED

## 2018-04-02 ENCOUNTER — Other Ambulatory Visit: Payer: Self-pay | Admitting: Family Medicine

## 2018-04-02 DIAGNOSIS — Z1231 Encounter for screening mammogram for malignant neoplasm of breast: Secondary | ICD-10-CM

## 2018-06-02 ENCOUNTER — Ambulatory Visit: Payer: Managed Care, Other (non HMO)

## 2018-06-21 ENCOUNTER — Ambulatory Visit
Admission: RE | Admit: 2018-06-21 | Discharge: 2018-06-21 | Disposition: A | Discharge status: Home / Self Care | Payer: Managed Care, Other (non HMO) | Source: Ambulatory Visit | Attending: Family Medicine | Admitting: Family Medicine

## 2018-06-21 ENCOUNTER — Other Ambulatory Visit: Payer: Self-pay

## 2018-06-21 DIAGNOSIS — Z1231 Encounter for screening mammogram for malignant neoplasm of breast: Secondary | ICD-10-CM

## 2018-06-23 LAB — HM MAMMOGRAPHY

## 2018-06-26 ENCOUNTER — Encounter: Payer: Self-pay | Admitting: Family Medicine

## 2018-11-28 ENCOUNTER — Encounter: Payer: Self-pay | Admitting: Emergency Medicine

## 2018-11-28 ENCOUNTER — Emergency Department: Payer: Managed Care, Other (non HMO)

## 2018-11-28 ENCOUNTER — Other Ambulatory Visit: Payer: Self-pay

## 2018-11-28 ENCOUNTER — Emergency Department
Admission: EM | Admit: 2018-11-28 | Discharge: 2018-11-28 | Disposition: A | Discharge status: Home / Self Care | Payer: Managed Care, Other (non HMO) | Attending: Emergency Medicine | Admitting: Emergency Medicine

## 2018-11-28 DIAGNOSIS — Y92019 Unspecified place in single-family (private) house as the place of occurrence of the external cause: Secondary | ICD-10-CM | POA: Insufficient documentation

## 2018-11-28 DIAGNOSIS — W010XXA Fall on same level from slipping, tripping and stumbling without subsequent striking against object, initial encounter: Secondary | ICD-10-CM | POA: Diagnosis not present

## 2018-11-28 DIAGNOSIS — Y998 Other external cause status: Secondary | ICD-10-CM | POA: Diagnosis not present

## 2018-11-28 DIAGNOSIS — S92141A Displaced dome fracture of right talus, initial encounter for closed fracture: Secondary | ICD-10-CM | POA: Insufficient documentation

## 2018-11-28 DIAGNOSIS — S99911A Unspecified injury of right ankle, initial encounter: Secondary | ICD-10-CM | POA: Diagnosis present

## 2018-11-28 DIAGNOSIS — Z87891 Personal history of nicotine dependence: Secondary | ICD-10-CM | POA: Insufficient documentation

## 2018-11-28 DIAGNOSIS — Z79899 Other long term (current) drug therapy: Secondary | ICD-10-CM | POA: Diagnosis not present

## 2018-11-28 DIAGNOSIS — Y9389 Activity, other specified: Secondary | ICD-10-CM | POA: Diagnosis not present

## 2018-11-28 MED ORDER — TRAMADOL HCL 50 MG PO TABS: 50.0000 mg | ORAL_TABLET | Freq: Four times a day (QID) | ORAL | 0 refills | Status: DC | PRN

## 2018-11-28 NOTE — Discharge Instructions (Signed)
Please call and schedule follow-up appointment with your podiatrist.  Do not take the tramadol if you are going to be driving.  Return to the emergency department for symptoms that change or worsen if you are unable to see primary care or your podiatrist.

## 2018-11-28 NOTE — ED Provider Notes (Signed)
[facility] Emergency Department Provider Note ____________________________________________  Time seen: Approximately 5:52 PM  I have reviewed the triage vital signs and the nursing notes.   HISTORY  Chief Complaint Ankle Injury    HPI Kathy Pugh is a 58 y.o. female who presents to the emergency department for evaluation and treatment of right foot pain.  She states that she twisted her foot and heard something pop.  Since that time she has had more bruising and swelling and is now unable to bear any weight on the foot.  She took 2 Aleve prior to arrival.  No previous injury to this foot that she can recall. Past Medical History:  Diagnosis Date  . GERD (gastroesophageal reflux disease) 2005   with esophageal stricture and HH per EGD  . Headache(784.0)    frequent, takes tylenol  . History of chicken pox   . Hx: UTI (urinary tract infection)    trimethoprim ppx  . Irritable bowel syndrome with diarrhea 2005   per GI, Dr. [name]  . Migraines    none in years  . Seasonal allergies   . Vitamin D deficiency     Patient Active Problem List   Diagnosis Date Noted  . Skin rash 02/01/2018  . Right knee pain 01/26/2016  . Postmenopausal 01/06/2015  . Hyperlipidemia 11/30/2013  . Vertigo 07/29/2013  . Vitamin D deficiency 08/20/2010  . Healthcare maintenance 08/16/2010  . Seasonal allergies   . Recurrent UTI     Past Surgical History:  Procedure Laterality Date  . BREAST BIOPSY Left   . COLONOSCOPY  01/2004   normal [name])  . ESOPHAGOGASTRODUODENOSCOPY  10/2003   GERD, HH, esoph stricture, neg H pylori  . HAMMER TOE SURGERY  11/2009   left foot - hammer toe and bunion  . laparoscopic uterine nerve ablation  1998   for endometriosis  . TONSILLECTOMY AND ADENOIDECTOMY  1983  . TUBAL LIGATION      Prior to Admission medications   Medication Sig Start Date End Date Taking? Authorizing Provider  cephALEXin (KEFLEX) 250 MG capsule Take  1 capsule by mouth 2 (two) times daily. Take for 7 days 01/24/18   [provider]  cetirizine (ZYRTEC) 10 MG tablet Take 10 mg by mouth daily.      [provider]  cholecalciferol (VITAMIN D) 1000 UNITS tablet Take 1,000 Units by mouth daily.    [provider]  COD LIVER OIL PO Take 1 capsule by mouth daily.    [provider]  Cyanocobalamin (VITAMIN B-12 SL) Place 1 tablet under the tongue daily. 1500 mcg    [provider]  nitrofurantoin, macrocrystal-monohydrate, (MACROBID) 100 MG capsule Take 1 capsule (100 mg total) by mouth daily as needed. 01/31/18   Ria Bush, MD  Omega-3 Fatty Acids (OMEGA 3 500) 500 MG CAPS Take 1 capsule by mouth daily.    [provider]  predniSONE (DELTASONE) 20 MG tablet Take 1 tablet by mouth. Take 4 days twice a day, THEN 4 days once a day 01/24/18   [provider]  traMADol (ULTRAM) 50 MG tablet Take 1 tablet (50 mg total) by mouth every 6 (six) hours as needed. 11/28/18   Deziah Renwick, Johnette Abraham B, FNP  vitamin C (ASCORBIC ACID) 500 MG tablet Take 500 mg by mouth daily.      [provider]  vitamin E (VITAMIN E) 400 UNIT capsule Take 400 Units by mouth daily.    [provider]  Allergies Vicodin [hydrocodone-acetaminophen]  Family History  Problem Relation Age of Onset  . Diabetes Father   . Coronary artery disease Father 1  . Hyperlipidemia Mother 33       hysterectomy for abnormal cells  . Hypertension Sister   . Stroke Maternal Grandfather   . Cancer Neg Hx        ? mother with uterine  . Breast cancer Neg Hx     Social History Social History   Tobacco Use  . Smoking status: Former Smoker    Quit date: 03/02/1983    Years since quitting: 35.7  . Smokeless tobacco: Never Used  Substance Use Topics  . Alcohol use: No  . Drug use: No    Review of Systems Constitutional: Negative for fever. Cardiovascular: Negative for chest pain. Respiratory: Negative  for shortness of breath. Musculoskeletal: Positive for right foot pain.  Negative for right knee pain. Skin: Positive for bruising and swelling over the right foot Neurological: Negative for decrease in sensation  ____________________________________________   PHYSICAL EXAM:  VITAL SIGNS: ED Triage Vitals  Enc Vitals Group     BP 11/28/18 1630 121/78     Pulse Rate 11/28/18 1630 83     Resp 11/28/18 1630 18     Temp 11/28/18 1630 98.7 F (37.1 C)     Temp Source 11/28/18 1630 Oral     SpO2 11/28/18 1630 96 %     Weight 11/28/18 1631 138 lb (62.6 kg)     Height 11/28/18 1631 5\' 4"  (1.626 m)     Head Circumference --      Peak Flow --      Pain Score 11/28/18 1631 10     Pain Loc --      Pain Edu? --      Excl. in [place]? --     Constitutional: Alert and oriented. Well appearing and in no acute distress. Eyes: Conjunctivae are clear without discharge or drainage Head: Atraumatic Neck: Supple.  No focal midline tenderness. Respiratory: No cough. Respirations are even and unlabored. Musculoskeletal: Patient able to demonstrate range of motion of toes of the right foot and right ankle.  Focal tenderness over the dorsal aspect of the foot overlying the proximal metatarsals Neurologic: Motor and sensory function is intact Skin: Ecchymosis and swelling noted over the dorsal aspect of the right foot Psychiatric: Affect and behavior are appropriate.  ____________________________________________   LABS (all labs ordered are listed, but only abnormal results are displayed)  Labs Reviewed - No data to display ____________________________________________  RADIOLOGY  Minimally displaced fracture over the superior, anterior aspect of the talus. ____________________________________________   PROCEDURES  .Splint Application  Date/Time: 11/28/2018 5:55 PM Performed by: Victorino Dike, FNP Authorized by: Victorino Dike, FNP   Consent:    Consent obtained:  Verbal   Consent  given by:  Patient   Risks discussed:  Pain and swelling Pre-procedure details:    Sensation:  Normal Procedure details:    Laterality:  Right   Supplies:  Prefabricated splint Post-procedure details:    Pain:  Unchanged   Sensation:  Normal   Patient tolerance of procedure:  Tolerated well, no immediate complications    ____________________________________________   INITIAL IMPRESSION / ASSESSMENT AND PLAN / ED COURSE  Kathy Pugh is a 58 y.o. who presents to the emergency department for treatment and evaluation of right foot pain. See HPI for further details.  X-ray and exam are consistent.  She has a very  small fracture over the anterior, superior aspect of the talus.  Patient instructed to follow-up with her podiatrist.  She was also instructed to return to the emergency department for symptoms that change or worsen if unable schedule an appointment with podiatry or primary care.  Medications - No data to display  Pertinent labs & imaging results that were available during my care of the patient were reviewed by me and considered in my medical decision making (see chart for details).  _________________________________________   FINAL CLINICAL IMPRESSION(S) / ED DIAGNOSES  Final diagnoses:  Closed displaced fracture of dome of right talus, initial encounter    ED Discharge Orders         Ordered    traMADol (ULTRAM) 50 MG tablet  Every 6 hours PRN     11/28/18 1803           If controlled substance prescribed during this visit, 12 month history viewed on the [place] prior to issuing an initial prescription for Schedule II or III opiod.   Victorino Dike, FNP 11/28/18 1806    Arta Silence, MD 11/28/18 2230

## 2018-11-28 NOTE — ED Triage Notes (Signed)
Pt presents to ED via POV with c/o R ankle pain that started earlier today when she tripped and fell holding her dog. Pt states took 2 Alleve PTA.

## 2018-11-28 NOTE — ED Notes (Signed)
Pt states she was taking her dog out and fell down her steps. Pt c/o right foot pain and states she is unable to bear any weight on it at this time

## 2019-01-26 ENCOUNTER — Telehealth: Payer: Self-pay

## 2019-01-26 NOTE — Telephone Encounter (Signed)
LVM w/ COVID screen, front door and back lab info 1.25.2021 TLJ

## 2019-01-28 ENCOUNTER — Other Ambulatory Visit: Payer: Self-pay | Admitting: Family Medicine

## 2019-01-28 ENCOUNTER — Other Ambulatory Visit (INDEPENDENT_AMBULATORY_CARE_PROVIDER_SITE_OTHER): Payer: Managed Care, Other (non HMO)

## 2019-01-28 ENCOUNTER — Other Ambulatory Visit: Payer: Self-pay

## 2019-01-28 DIAGNOSIS — E559 Vitamin D deficiency, unspecified: Secondary | ICD-10-CM

## 2019-01-28 DIAGNOSIS — E785 Hyperlipidemia, unspecified: Secondary | ICD-10-CM

## 2019-01-28 LAB — COMPREHENSIVE METABOLIC PANEL
ALT: 14 U/L (ref 0–35)
AST: 17 U/L (ref 0–37)
Albumin: 4.5 g/dL (ref 3.5–5.2)
Alkaline Phosphatase: 82 U/L (ref 39–117)
BUN: 13 mg/dL (ref 6–23)
CO2: 28 mEq/L (ref 19–32)
Calcium: 9.4 mg/dL (ref 8.4–10.5)
Chloride: 105 mEq/L (ref 96–112)
Creatinine, Ser: 0.68 mg/dL (ref 0.40–1.20)
GFR: 88.05 mL/min (ref >60.00)
Glucose, Bld: 85 mg/dL (ref 70–99)
Potassium: 4.2 mEq/L (ref 3.5–5.1)
Sodium: 140 mEq/L (ref 135–145)
Total Bilirubin: 0.4 mg/dL (ref 0.2–1.2)
Total Protein: 6.6 g/dL (ref 6.0–8.3)

## 2019-01-28 LAB — TSH: TSH: 1.57 u[IU]/mL (ref 0.35–4.50)

## 2019-01-28 LAB — LIPID PANEL
Cholesterol: 153 mg/dL (ref 0–200)
HDL: 52.9 mg/dL (ref >39.00)
LDL Cholesterol: 87 mg/dL (ref 0–99)
NonHDL: 100.39
Total CHOL/HDL Ratio: 3
Triglycerides: 69 mg/dL (ref 0.0–149.0)
VLDL: 13.8 mg/dL (ref 0.0–40.0)

## 2019-01-28 LAB — VITAMIN D 25 HYDROXY (VIT D DEFICIENCY, FRACTURES): VITD: 32.95 ng/mL (ref 30.00–100.00)

## 2019-02-02 ENCOUNTER — Other Ambulatory Visit: Payer: Self-pay

## 2019-02-02 ENCOUNTER — Encounter: Payer: Self-pay | Admitting: Family Medicine

## 2019-02-02 ENCOUNTER — Ambulatory Visit (INDEPENDENT_AMBULATORY_CARE_PROVIDER_SITE_OTHER): Payer: Managed Care, Other (non HMO) | Admitting: Family Medicine

## 2019-02-02 VITALS — BP 122/62 | HR 80 | Temp 97.9°F | Ht 63.25 in | Wt 137.5 lb

## 2019-02-02 DIAGNOSIS — Z0000 Encounter for general adult medical examination without abnormal findings: Secondary | ICD-10-CM

## 2019-02-02 DIAGNOSIS — Z1211 Encounter for screening for malignant neoplasm of colon: Secondary | ICD-10-CM

## 2019-02-02 DIAGNOSIS — E785 Hyperlipidemia, unspecified: Secondary | ICD-10-CM

## 2019-02-02 DIAGNOSIS — E559 Vitamin D deficiency, unspecified: Secondary | ICD-10-CM | POA: Diagnosis not present

## 2019-02-02 MED ORDER — VITAMIN B-12 1000 MCG SL SUBL: 1.0000 | SUBLINGUAL_TABLET | Freq: Every day | SUBLINGUAL | 0 refills | Status: AC

## 2019-02-02 NOTE — Progress Notes (Signed)
This visit was conducted in person.  BP 122/62 (BP Location: Left Arm, Patient Position: Sitting, Cuff Size: Normal)   Pulse 80   Temp 97.9 F (36.6 C) (Temporal)   Ht 5' 3.25" (1.607 m)   Wt 137 lb 8 oz (62.4 kg)   SpO2 99%   BMI 24.16 kg/m    CC: CPE Subjective:    Patient ID: Kathy Pugh, female    DOB: 11-Feb-1960, 59 y.o.   MRN: [id number]  HPI: Kathy Pugh is a 59 y.o. female presenting on 02/02/2019 for Annual Exam   She had a fall during Thanksgiving while carrying her small dog down stairs. Fractured talus. Healed well. Did not tolerate Tramadol.  Preventative: COLONOSCOPY Date: 01/2004 normal [name]). Stool kits yearly since, normal.  Mammogram -WNL 06/2018 at breast center. Does SBE at home without concerns. Will do CBE QOY with pelvic.  Well woman exam - paps always normal. Mom with ?h/o uterine cancer, abnormal cells that led to hysterectomy. Given fm hx requests q2 yrs. Latest 01/2018 normal.  Menopause~2003 at age 81. Off angeliq since 2018. Was on for hot flashes and vag dryness. Bone density - reviewed and discussed cal, vit D in diet, and weight bearing exercise  Flu shot at work. Tdap 11/2012 shingrix - had first one 11/2018 - will repeat Seat belt use discussed  Sunscreen use discussed. No suspicious moles on skin. 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 Yogurt for breakfast, 1 cup milk/day      Relevant past medical, surgical, family and social history reviewed and updated as indicated. Interim medical history since our last visit reviewed. Allergies and medications reviewed and updated. Outpatient Medications Prior to Visit  Medication Sig  Dispense Refill  . cetirizine (ZYRTEC) 10 MG tablet Take 10 mg by mouth daily.      . cholecalciferol (VITAMIN D) 1000 UNITS tablet Take 1,000 Units by mouth daily.    . COD LIVER OIL PO Take 1 capsule by mouth daily.    . vitamin C (ASCORBIC ACID) 500 MG tablet Take 500 mg by mouth daily.      . vitamin E (VITAMIN E) 400 UNIT capsule Take 400 Units by mouth daily.    . nitrofurantoin, macrocrystal-monohydrate, (MACROBID) 100 MG capsule Take 1 capsule (100 mg total) by mouth daily as needed. 10 capsule 6  . cephALEXin (KEFLEX) 250 MG capsule Take 1 capsule by mouth 2 (two) times daily. Take for 7 days    . Cyanocobalamin (VITAMIN B-12 SL) Place 1 tablet under the tongue daily. 1500 mcg    . Omega-3 Fatty Acids (OMEGA 3 500) 500 MG CAPS Take 1 capsule by mouth daily.    . predniSONE (DELTASONE) 20 MG tablet Take 1 tablet by mouth. Take 4 days twice a day, THEN 4 days once a day    . traMADol (ULTRAM) 50 MG tablet Take 1 tablet (50 mg total) by mouth every 6 (six) hours as needed. 20 tablet 0   No facility-administered medications prior to visit.     Per HPI unless specifically indicated in ROS section below Review of Systems  Constitutional: Negative for activity change, appetite change, chills, fatigue, fever and unexpected  weight change.  HENT: Negative for hearing loss.   Eyes: Negative for visual disturbance.  Respiratory: Negative for cough, chest tightness, shortness of breath and wheezing.   Cardiovascular: Negative for chest pain, palpitations and leg swelling.  Gastrointestinal: Negative for abdominal distention, abdominal pain, blood in stool, constipation, diarrhea, nausea and vomiting.  Genitourinary: Negative for difficulty urinating and hematuria.  Musculoskeletal: Negative for arthralgias, myalgias and neck pain.  Skin: Negative for rash.  Neurological: Negative for dizziness, seizures, syncope and headaches.  Hematological: Negative for adenopathy. Does not bruise/bleed  easily.  Psychiatric/Behavioral: Negative for dysphoric mood. The patient is not nervous/anxious.    Objective:    BP 122/62 (BP Location: Left Arm, Patient Position: Sitting, Cuff Size: Normal)   Pulse 80   Temp 97.9 F (36.6 C) (Temporal)   Ht 5' 3.25" (1.607 m)   Wt 137 lb 8 oz (62.4 kg)   SpO2 99%   BMI 24.16 kg/m   Wt Readings from Last 3 Encounters:  02/02/19 137 lb 8 oz (62.4 kg)  11/28/18 138 lb (62.6 kg)  01/31/18 135 lb 8 oz (61.5 kg)    Physical Exam Vitals and nursing note reviewed.  Constitutional:      General: She is not in acute distress.    Appearance: Normal appearance. She is well-developed. She is not ill-appearing.  HENT:     Head: Normocephalic and atraumatic.     Right Ear: Hearing, tympanic membrane, ear canal and external ear normal.     Left Ear: Hearing, tympanic membrane, ear canal and external ear normal.     Mouth/Throat:     Pharynx: Uvula midline.  Eyes:     General: No scleral icterus.    Extraocular Movements: Extraocular movements intact.     Conjunctiva/sclera: Conjunctivae normal.     Pupils: Pupils are equal, round, and reactive to light.  Cardiovascular:     Rate and Rhythm: Normal rate and regular rhythm.     Pulses: Normal pulses.          Radial pulses are 2+ on the right side and 2+ on the left side.     Heart sounds: Normal heart sounds. No murmur.  Pulmonary:     Effort: Pulmonary effort is normal. No respiratory distress.     Breath sounds: Normal breath sounds. No wheezing, rhonchi or rales.  Abdominal:     General: Abdomen is flat. Bowel sounds are normal. There is no distension.     Palpations: Abdomen is soft. There is no mass.     Tenderness: There is no abdominal tenderness. There is no guarding or rebound.     Hernia: No hernia is present.  Musculoskeletal:        General: Normal range of motion.     Cervical back: Normal range of motion and neck supple.     Right lower leg: No edema.     Left lower leg: No  edema.  Lymphadenopathy:     Cervical: No cervical adenopathy.  Skin:    General: Skin is warm and dry.     Findings: No rash.  Neurological:     General: No focal deficit present.     Mental Status: She is alert and oriented to person, place, and time.     Comments: CN grossly intact, station and gait intact  Psychiatric:        Mood and Affect: Mood normal.        Behavior: Behavior normal.  Thought Content: Thought content normal.        Judgment: Judgment normal.       Results for orders placed or performed in visit on 01/28/19  TSH  Result Value Ref Range   TSH 1.57 0.35 - 4.50 uIU/mL  VITAMIN D 25 Hydroxy (Vit-D Deficiency, Fractures)  Result Value Ref Range   VITD 32.95 30.00 - 100.00 ng/mL  Comprehensive metabolic panel  Result Value Ref Range   Sodium 140 135 - 145 mEq/L   Potassium 4.2 3.5 - 5.1 mEq/L   Chloride 105 96 - 112 mEq/L   CO2 28 19 - 32 mEq/L   Glucose, Bld 85 70 - 99 mg/dL   BUN 13 6 - 23 mg/dL   Creatinine, Ser 0.68 0.40 - 1.20 mg/dL   Total Bilirubin 0.4 0.2 - 1.2 mg/dL   Alkaline Phosphatase 82 39 - 117 U/L   AST 17 0 - 37 U/L   ALT 14 0 - 35 U/L   Total Protein 6.6 6.0 - 8.3 g/dL   Albumin 4.5 3.5 - 5.2 g/dL   GFR 88.05 >60.00 mL/min   Calcium 9.4 8.4 - 10.5 mg/dL  Lipid panel  Result Value Ref Range   Cholesterol 153 0 - 200 mg/dL   Triglycerides 69.0 0.0 - 149.0 mg/dL   HDL 52.90 >39.00 mg/dL   VLDL 13.8 0.0 - 40.0 mg/dL   LDL Cholesterol 87 0 - 99 mg/dL   Total CHOL/HDL Ratio 3    NonHDL 100.39    Assessment & Plan:  This visit occurred during the SARS-CoV-2 public health emergency.  Safety protocols were in place, including screening questions prior to the visit, additional usage of staff PPE, and extensive cleaning of exam room while observing appropriate contact time as indicated for disinfecting solutions.   Problem List Items Addressed This Visit    Vitamin D deficiency    Increase vit D to 2000 IU during winter months,  decrease back to 1000 IU during summer months.       Hyperlipidemia    Marked improvement with healthy diet changes. Not on medication. Congratulated. The 10-year ASCVD risk score Mikey Bussing DC Brooke Bonito., et al., 2013) is: 2.5%   Values used to calculate the score:     Age: 38 years     Sex: Female     Is Non-Hispanic African American: No     Diabetic: No     Tobacco smoker: No     Systolic Blood Pressure: 161 mmHg     Is BP treated: No     HDL Cholesterol: 52.9 mg/dL     Total Cholesterol: 153 mg/dL       Healthcare maintenance - Primary    Preventative protocols reviewed and updated unless pt declined. Discussed healthy diet and lifestyle.        Other Visit Diagnoses    Special screening for malignant neoplasms, colon       Relevant Orders   Fecal occult blood, imunochemical       Meds ordered this encounter  Medications  . Cyanocobalamin (VITAMIN B-12) 1000 MCG SUBL    Sig: Place 1 tablet (1,000 mcg total) under the tongue daily. 1500 mcg    Refill:  0   Orders Placed This Encounter  Procedures  . Fecal occult blood, imunochemical    Standing Status:   Future    Standing Expiration Date:   02/02/2020   Patient Instructions  Pass by lab to pick up stool kit.  Ok to finish  shingles series.  Increase vitamin D to 2000 units in the winter months.  You are doing well today! Return as needed or in 1 year for next physical.    Follow up plan: Return in about 1 year (around 02/02/2020) for annual exam, prior fasting for blood work.  Ria Bush, MD

## 2019-02-02 NOTE — Assessment & Plan Note (Signed)
Marked improvement with healthy diet changes. Not on medication. Congratulated. The 10-year ASCVD risk score Mikey Bussing DC Brooke Bonito., et al., 2013) is: 2.5%   Values used to calculate the score:     Age: 59 years     Sex: Female     Is Non-Hispanic African American: No     Diabetic: No     Tobacco smoker: No     Systolic Blood Pressure: 123XX123 mmHg     Is BP treated: No     HDL Cholesterol: 52.9 mg/dL     Total Cholesterol: 153 mg/dL

## 2019-02-02 NOTE — Assessment & Plan Note (Signed)
Preventative protocols reviewed and updated unless pt declined. Discussed healthy diet and lifestyle.  

## 2019-02-02 NOTE — Assessment & Plan Note (Signed)
Increase vit D to 2000 IU during winter months, decrease back to 1000 IU during summer months.

## 2019-02-02 NOTE — Patient Instructions (Addendum)
Pass by lab to pick up stool kit.  Ok to finish shingles series.  Increase vitamin D to 2000 units in the winter months.  You are doing well today! Return as needed or in 1 year for next physical.

## 2019-04-02 ENCOUNTER — Other Ambulatory Visit: Payer: Self-pay | Admitting: Family Medicine

## 2019-05-19 ENCOUNTER — Other Ambulatory Visit: Payer: Self-pay | Admitting: Family Medicine

## 2019-05-19 DIAGNOSIS — Z1231 Encounter for screening mammogram for malignant neoplasm of breast: Secondary | ICD-10-CM

## 2019-06-23 ENCOUNTER — Ambulatory Visit
Admission: RE | Admit: 2019-06-23 | Discharge: 2019-06-23 | Disposition: A | Discharge status: Home / Self Care | Payer: No Typology Code available for payment source | Source: Ambulatory Visit | Attending: Family Medicine | Admitting: Family Medicine

## 2019-06-23 ENCOUNTER — Other Ambulatory Visit: Payer: Self-pay

## 2019-06-23 DIAGNOSIS — Z1231 Encounter for screening mammogram for malignant neoplasm of breast: Secondary | ICD-10-CM

## 2019-06-24 LAB — HM MAMMOGRAPHY

## 2019-06-25 ENCOUNTER — Encounter: Payer: Self-pay | Admitting: Family Medicine

## 2019-10-23 ENCOUNTER — Other Ambulatory Visit (INDEPENDENT_AMBULATORY_CARE_PROVIDER_SITE_OTHER): Payer: No Typology Code available for payment source

## 2019-10-23 DIAGNOSIS — Z1211 Encounter for screening for malignant neoplasm of colon: Secondary | ICD-10-CM | POA: Diagnosis not present

## 2019-10-23 LAB — FECAL OCCULT BLOOD, GUAIAC: Fecal Occult Blood: NEGATIVE

## 2019-10-23 LAB — FECAL OCCULT BLOOD, IMMUNOCHEMICAL: Fecal Occult Bld: NEGATIVE

## 2019-10-26 ENCOUNTER — Encounter: Payer: Self-pay | Admitting: Family Medicine

## 2019-12-15 ENCOUNTER — Encounter: Payer: Self-pay | Admitting: Family Medicine

## 2019-12-15 ENCOUNTER — Other Ambulatory Visit: Payer: Self-pay

## 2019-12-15 ENCOUNTER — Ambulatory Visit (INDEPENDENT_AMBULATORY_CARE_PROVIDER_SITE_OTHER): Payer: No Typology Code available for payment source | Admitting: Family Medicine

## 2019-12-15 VITALS — BP 104/62 | HR 80 | Temp 98.0°F | Resp 14 | Ht 63.25 in | Wt 137.0 lb

## 2019-12-15 DIAGNOSIS — H6122 Impacted cerumen, left ear: Secondary | ICD-10-CM

## 2019-12-15 DIAGNOSIS — H6120 Impacted cerumen, unspecified ear: Secondary | ICD-10-CM | POA: Insufficient documentation

## 2019-12-15 NOTE — Progress Notes (Signed)
Patient ID: Kathy Pugh, female    DOB: Jul 24, 1960, 59 y.o.   MRN: [id number]  This visit was conducted in person.  BP 104/62 (BP Location: Left Arm, Patient Position: Sitting, Cuff Size: Normal)   Pulse 80   Temp 98 F (36.7 C) (Oral)   Resp 14   Ht 5' 3.25" (1.607 m)   Wt 137 lb (62.1 kg)   SpO2 99%   BMI 24.08 kg/m    CC: left ear pain Subjective:   HPI: Kathy Pugh is a 59 y.o. female presenting on 12/15/2019 for Acute Visit (Left ear pain x2-3 weeks)    She reports new onset pain in left ear ongoing x 2-3 weeks. No proceeding cold or allergies. Allergies well controlled on year round zyrtec.  She describes it as pain in tragus of ear.  No redness or swelling noted.  No discharge.  No fever.  No ST.  No past ear problems.      Relevant past medical, surgical, family and social history reviewed and updated as indicated. Interim medical history since our last visit reviewed. Allergies and medications reviewed and updated. Outpatient Medications Prior to Visit  Medication Sig Dispense Refill  . cetirizine (ZYRTEC) 10 MG tablet Take 10 mg by mouth daily.    . cholecalciferol (VITAMIN D) 1000 UNITS tablet Take 1,000 Units by mouth daily.    . COD LIVER OIL PO Take 1 capsule by mouth daily.    . Cyanocobalamin (VITAMIN B-12) 1000 MCG SUBL Place 1 tablet (1,000 mcg total) under the tongue daily. 1500 mcg  0  . nitrofurantoin, macrocrystal-monohydrate, (MACROBID) 100 MG capsule TAKE 1 CAPSULE (100 MG TOTAL) BY MOUTH DAILY AS NEEDED. 10 capsule 6  . Omega-3 Fatty Acids (FISH OIL) 1000 MG CAPS Take 1 capsule by mouth daily.    . vitamin C (ASCORBIC ACID) 500 MG tablet Take 500 mg by mouth daily.    . vitamin E 180 MG (400 UNITS) capsule Take 400 Units by mouth daily.     No facility-administered medications prior to visit.     Per HPI unless specifically indicated in ROS section below Review of Systems  Constitutional: Negative for fatigue and fever.  HENT:  Positive for ear pain. Negative for congestion, hearing loss, mouth sores, nosebleeds and postnasal drip.   Eyes: Negative for pain.  Respiratory: Negative for chest tightness and shortness of breath.   Cardiovascular: Negative for chest pain, palpitations and leg swelling.  Gastrointestinal: Negative for abdominal pain.  Genitourinary: Negative for dysuria.   Objective:  BP 104/62 (BP Location: Left Arm, Patient Position: Sitting, Cuff Size: Normal)   Pulse 80   Temp 98 F (36.7 C) (Oral)   Resp 14   Ht 5' 3.25" (1.607 m)   Wt 137 lb (62.1 kg)   SpO2 99%   BMI 24.08 kg/m   Wt Readings from Last 3 Encounters:  12/15/19 137 lb (62.1 kg)  02/02/19 137 lb 8 oz (62.4 kg)  11/28/18 138 lb (62.6 kg)      Physical Exam Constitutional:      General: She is not in acute distress.Vital signs are normal.     Appearance: Normal appearance. She is well-developed and well-nourished. She is not ill-appearing or toxic-appearing.  HENT:     Head: Normocephalic.     Right Ear: Hearing, tympanic membrane, ear canal and external ear normal. No tenderness. No middle ear effusion. There is no impacted cerumen. Tympanic membrane is not erythematous, retracted or  bulging.     Left Ear: Hearing, tympanic membrane, ear canal and external ear normal. Tenderness present.  No middle ear effusion. There is impacted cerumen. Tympanic membrane is not erythematous, retracted or bulging.     Ears:     Comments: Left ear irrigated partially, large amount of ear wax removed with irrigation, but had to stop given pain  Pt reported ear felt better with wax removal.    Nose: No mucosal edema or rhinorrhea.     Right Sinus: No maxillary sinus tenderness or frontal sinus tenderness.     Left Sinus: No maxillary sinus tenderness or frontal sinus tenderness.     Mouth/Throat:     Mouth: Oropharynx is clear and moist and mucous membranes are normal.     Pharynx: Uvula midline.  Eyes:     General: Lids are normal. Lids  are everted, no foreign bodies appreciated.     Extraocular Movements: EOM normal.     Conjunctiva/sclera: Conjunctivae normal.     Pupils: Pupils are equal, round, and reactive to light.  Neck:     Thyroid: No thyroid mass or thyromegaly.     Vascular: No carotid bruit.     Trachea: Trachea normal.  Cardiovascular:     Rate and Rhythm: Normal rate and regular rhythm.     Pulses: Normal pulses and intact distal pulses.     Heart sounds: Normal heart sounds, S1 normal and S2 normal. No murmur heard. No friction rub. No gallop.   Pulmonary:     Effort: Pulmonary effort is normal. No tachypnea or respiratory distress.     Breath sounds: Normal breath sounds. No decreased breath sounds, wheezing, rhonchi or rales.  Abdominal:     General: Bowel sounds are normal.     Palpations: Abdomen is soft.     Tenderness: There is no abdominal tenderness.  Musculoskeletal:     Cervical back: Normal range of motion and neck supple.  Skin:    General: Skin is warm, dry and intact.     Findings: No rash.  Neurological:     Mental Status: She is alert.  Psychiatric:        Mood and Affect: Mood is not anxious or depressed.        Speech: Speech normal.        Behavior: Behavior normal. Behavior is cooperative.        Thought Content: Thought content normal.        Cognition and Memory: Cognition and memory normal.        Judgment: Judgment normal.       Results for orders placed or performed in visit on 10/26/19  Fecal Occult Blood, Guaiac  Result Value Ref Range   Fecal Occult Blood Negative     This visit occurred during the SARS-CoV-2 public health emergency.  Safety protocols were in place, including screening questions prior to the visit, additional usage of staff PPE, and extensive cleaning of exam room while observing appropriate contact time as indicated for disinfecting solutions.   COVID 19 screen:  No recent travel or known exposure to COVID19 The patient denies respiratory  symptoms of COVID 19 at this time. The importance of social distancing was discussed today.   Assessment and Plan    Problem List Items Addressed This Visit    Impacted cerumen of left ear - Primary    Partially removed with irrigation.  Start home ear wax drops and return if wax not resolving and pain continuing  at end of week. Will re-irrigate at no charge.          Eliezer Lofts, MD

## 2019-12-15 NOTE — Assessment & Plan Note (Signed)
Partially removed with irrigation.  Start home ear wax drops and return if wax not resolving and pain continuing at end of week. Will re-irrigate at no charge.

## 2020-01-31 ENCOUNTER — Other Ambulatory Visit: Payer: Self-pay | Admitting: Family Medicine

## 2020-01-31 DIAGNOSIS — E559 Vitamin D deficiency, unspecified: Secondary | ICD-10-CM

## 2020-01-31 DIAGNOSIS — E785 Hyperlipidemia, unspecified: Secondary | ICD-10-CM

## 2020-02-02 ENCOUNTER — Other Ambulatory Visit (INDEPENDENT_AMBULATORY_CARE_PROVIDER_SITE_OTHER): Payer: No Typology Code available for payment source

## 2020-02-02 ENCOUNTER — Other Ambulatory Visit: Payer: Self-pay

## 2020-02-02 DIAGNOSIS — E785 Hyperlipidemia, unspecified: Secondary | ICD-10-CM

## 2020-02-02 DIAGNOSIS — E559 Vitamin D deficiency, unspecified: Secondary | ICD-10-CM | POA: Diagnosis not present

## 2020-02-02 LAB — LIPID PANEL
Cholesterol: 164 mg/dL (ref 0–200)
HDL: 54.5 mg/dL (ref >39.00)
LDL Cholesterol: 99 mg/dL (ref 0–99)
NonHDL: 109.97
Total CHOL/HDL Ratio: 3
Triglycerides: 56 mg/dL (ref 0.0–149.0)
VLDL: 11.2 mg/dL (ref 0.0–40.0)

## 2020-02-02 LAB — COMPREHENSIVE METABOLIC PANEL
ALT: 13 U/L (ref 0–35)
AST: 18 U/L (ref 0–37)
Albumin: 4.5 g/dL (ref 3.5–5.2)
Alkaline Phosphatase: 65 U/L (ref 39–117)
BUN: 13 mg/dL (ref 6–23)
CO2: 29 mEq/L (ref 19–32)
Calcium: 9.6 mg/dL (ref 8.4–10.5)
Chloride: 103 mEq/L (ref 96–112)
Creatinine, Ser: 0.71 mg/dL (ref 0.40–1.20)
GFR: 91.57 mL/min (ref >60.00)
Glucose, Bld: 86 mg/dL (ref 70–99)
Potassium: 4.1 mEq/L (ref 3.5–5.1)
Sodium: 138 mEq/L (ref 135–145)
Total Bilirubin: 0.5 mg/dL (ref 0.2–1.2)
Total Protein: 6.9 g/dL (ref 6.0–8.3)

## 2020-02-02 LAB — VITAMIN D 25 HYDROXY (VIT D DEFICIENCY, FRACTURES): VITD: 46.43 ng/mL (ref 30.00–100.00)

## 2020-02-04 ENCOUNTER — Other Ambulatory Visit: Payer: Self-pay

## 2020-02-05 ENCOUNTER — Other Ambulatory Visit (HOSPITAL_COMMUNITY)
Admission: RE | Admit: 2020-02-05 | Discharge: 2020-02-05 | Disposition: A | Discharge status: Home / Self Care | Payer: No Typology Code available for payment source | Source: Ambulatory Visit | Attending: Family Medicine | Admitting: Family Medicine

## 2020-02-05 ENCOUNTER — Ambulatory Visit (INDEPENDENT_AMBULATORY_CARE_PROVIDER_SITE_OTHER): Payer: No Typology Code available for payment source | Admitting: Family Medicine

## 2020-02-05 ENCOUNTER — Encounter: Payer: Self-pay | Admitting: Family Medicine

## 2020-02-05 VITALS — BP 108/68 | HR 73 | Temp 98.0°F | Ht 63.25 in | Wt 137.4 lb

## 2020-02-05 DIAGNOSIS — Z01419 Encounter for gynecological examination (general) (routine) without abnormal findings: Secondary | ICD-10-CM | POA: Insufficient documentation

## 2020-02-05 DIAGNOSIS — E559 Vitamin D deficiency, unspecified: Secondary | ICD-10-CM | POA: Diagnosis not present

## 2020-02-05 DIAGNOSIS — Z0000 Encounter for general adult medical examination without abnormal findings: Secondary | ICD-10-CM | POA: Diagnosis not present

## 2020-02-05 DIAGNOSIS — E785 Hyperlipidemia, unspecified: Secondary | ICD-10-CM

## 2020-02-05 DIAGNOSIS — N390 Urinary tract infection, site not specified: Secondary | ICD-10-CM

## 2020-02-05 NOTE — Patient Instructions (Signed)
You are doing well today Return as needed or in 1 year for next physical.   Health Maintenance for Postmenopausal Women Menopause is a normal process in which your ability to get pregnant comes to an end. This process happens slowly over many months or years, usually between the ages of 48 and 55. Menopause is complete when you have missed your menstrual periods for 12 months. It is important to talk with your health care provider about some of the most common conditions that affect women after menopause (postmenopausal women). These include heart disease, cancer, and bone loss (osteoporosis). Adopting a healthy lifestyle and getting preventive care can help to promote your health and wellness. The actions you take can also lower your chances of developing some of these common conditions. What should I know about menopause? During menopause, you may get a number of symptoms, such as:  Hot flashes. These can be moderate or severe.  Night sweats.  Decrease in sex drive.  Mood swings.  Headaches.  Tiredness.  Irritability.  Memory problems.  Insomnia. Choosing to treat or not to treat these symptoms is a decision that you make with your health care provider. Do I need hormone replacement therapy?  Hormone replacement therapy is effective in treating symptoms that are caused by menopause, such as hot flashes and night sweats.  Hormone replacement carries certain risks, especially as you become older. If you are thinking about using estrogen or estrogen with progestin, discuss the benefits and risks with your health care provider. What is my risk for heart disease and stroke? The risk of heart disease, heart attack, and stroke increases as you age. One of the causes may be a change in the body's hormones during menopause. This can affect how your body uses dietary fats, triglycerides, and cholesterol. Heart attack and stroke are medical emergencies. There are many things that you can do  to help prevent heart disease and stroke. Watch your blood pressure  High blood pressure causes heart disease and increases the risk of stroke. This is more likely to develop in people who have high blood pressure readings, are of African descent, or are overweight.  Have your blood pressure checked: ? Every 3-5 years if you are 18-39 years of age. ? Every year if you are 40 years old or older. Eat a healthy diet  Eat a diet that includes plenty of vegetables, fruits, low-fat dairy products, and lean protein.  Do not eat a lot of foods that are high in solid fats, added sugars, or sodium.   Get regular exercise Get regular exercise. This is one of the most important things you can do for your health. Most adults should:  Try to exercise for at least 150 minutes each week. The exercise should increase your heart rate and make you sweat (moderate-intensity exercise).  Try to do strengthening exercises at least twice each week. Do these in addition to the moderate-intensity exercise.  Spend less time sitting. Even light physical activity can be beneficial. Other tips  Work with your health care provider to achieve or maintain a healthy weight.  Do not use any products that contain nicotine or tobacco, such as cigarettes, e-cigarettes, and chewing tobacco. If you need help quitting, ask your health care provider.  Know your numbers. Ask your health care provider to check your cholesterol and your blood sugar (glucose). Continue to have your blood tested as directed by your health care provider. Do I need screening for cancer? Depending on your health   history and family history, you may need to have cancer screening at different stages of your life. This may include screening for:  Breast cancer.  Cervical cancer.  Lung cancer.  Colorectal cancer. What is my risk for osteoporosis? After menopause, you may be at increased risk for osteoporosis. Osteoporosis is a condition in which  bone destruction happens more quickly than new bone creation. To help prevent osteoporosis or the bone fractures that can happen because of osteoporosis, you may take the following actions:  If you are 19-50 years old, get at least 1,000 mg of calcium and at least 600 mg of vitamin D per day.  If you are older than age 50 but younger than age 70, get at least 1,200 mg of calcium and at least 600 mg of vitamin D per day.  If you are older than age 70, get at least 1,200 mg of calcium and at least 800 mg of vitamin D per day. Smoking and drinking excessive alcohol increase the risk of osteoporosis. Eat foods that are rich in calcium and vitamin D, and do weight-bearing exercises several times each week as directed by your health care provider. How does menopause affect my mental health? Depression may occur at any age, but it is more common as you become older. Common symptoms of depression include:  Low or sad mood.  Changes in sleep patterns.  Changes in appetite or eating patterns.  Feeling an overall lack of motivation or enjoyment of activities that you previously enjoyed.  Frequent crying spells. Talk with your health care provider if you think that you are experiencing depression. General instructions See your health care provider for regular wellness exams and vaccines. This may include:  Scheduling regular health, dental, and eye exams.  Getting and maintaining your vaccines. These include: ? Influenza vaccine. Get this vaccine each year before the flu season begins. ? Pneumonia vaccine. ? Shingles vaccine. ? Tetanus, diphtheria, and pertussis (Tdap) booster vaccine. Your health care provider may also recommend other immunizations. Tell your health care provider if you have ever been abused or do not feel safe at home. Summary  Menopause is a normal process in which your ability to get pregnant comes to an end.  This condition causes hot flashes, night sweats, decreased  interest in sex, mood swings, headaches, or lack of sleep.  Treatment for this condition may include hormone replacement therapy.  Take actions to keep yourself healthy, including exercising regularly, eating a healthy diet, watching your weight, and checking your blood pressure and blood sugar levels.  Get screened for cancer and depression. Make sure that you are up to date with all your vaccines. This information is not intended to replace advice given to you by your health care provider. Make sure you discuss any questions you have with your health care provider. Document Revised: 12/11/2017 Document Reviewed: 12/11/2017 Elsevier Patient Education  2021 Elsevier Inc.  

## 2020-02-05 NOTE — Assessment & Plan Note (Addendum)
macrobid postcoitally.

## 2020-02-05 NOTE — Progress Notes (Signed)
Patient ID: Kathy Pugh, female    DOB: Aug 21, 1960, 60 y.o.   MRN: [id number]  This visit was conducted in person.  BP 108/68 (BP Location: Left Arm, Patient Position: Sitting, Cuff Size: Normal)   Pulse 73   Temp 98 F (36.7 C) (Temporal)   Ht 5' 3.25" (1.607 m)   Wt 137 lb 7 oz (62.3 kg)   SpO2 99%   BMI 24.15 kg/m   BP Readings from Last 3 Encounters:  02/05/20 108/68  12/15/19 104/62  02/02/19 122/62    CC: CPE Subjective:   HPI: Kathy Pugh is a 60 y.o. female presenting on 02/05/2020 for Annual Exam   Preventative: COLONOSCOPY Date: 01/2004 normal [name]).Stool kits yearlysince,normal. Latest 10/2019 Mammogram -WNL 06/2019 at breast center. Does SBE at homewithout concerns. Will do CBE QOY with pelvic.  Well woman exam - paps always normal. Mom with ?h/o uterine cancer, abnormal cells that led to hysterectomy. Given fm hx requests q2 yrs. Latest 01/2018 normal. rpt today  Menopause~2003 at age 70.Offangeliqsince 2018. Was on forhot flashes and vag dryness. Bone density - reviewed and discussed cal, vit D in diet, and weight bearing exercise. Good with calcium intake.  Flu shot at work. COVID vaccine Pfizer 03/2019, 04/2019, 10/2019 Tdap 11/2012  shingrix - 11/2018, 05/2019 Seat belt use discussed  Sunscreen use discussed. No suspicious moles on skin. 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 Yogurt for breakfast, 1 cup milk/day      Relevant past medical, surgical, family and social history reviewed and updated as indicated. Interim medical history since our last visit  reviewed. Allergies and medications reviewed and updated. Outpatient Medications Prior to Visit  Medication Sig Dispense Refill  . cetirizine (ZYRTEC) 10 MG tablet Take 10 mg by mouth daily.    . Cholecalciferol (VITAMIN D3) 50 MCG (2000 UT) TABS Take by mouth daily.    . COD LIVER OIL PO Take 1 capsule by mouth daily.    . Cyanocobalamin (VITAMIN B-12) 1000 MCG SUBL Place 1 tablet (1,000 mcg total) under the tongue daily. 1500 mcg  0  . nitrofurantoin, macrocrystal-monohydrate, (MACROBID) 100 MG capsule TAKE 1 CAPSULE (100 MG TOTAL) BY MOUTH DAILY AS NEEDED. 10 capsule 6  . vitamin C (ASCORBIC ACID) 500 MG tablet Take 500 mg by mouth daily.    . vitamin E 180 MG (400 UNITS) capsule Take 400 Units by mouth daily.    . cholecalciferol (VITAMIN D) 1000 UNITS tablet Take 1,000 Units by mouth daily.    . Omega-3 Fatty Acids (FISH OIL) 1000 MG CAPS Take 1 capsule by mouth daily.     No facility-administered medications prior to visit.     Per HPI unless specifically indicated in ROS section below Review of Systems  Constitutional: Negative for activity change, appetite change, chills, fatigue, fever and unexpected weight change.  HENT: Negative for hearing loss.   Eyes: Negative for visual disturbance.  Respiratory: Negative for cough, chest tightness, shortness of breath and wheezing.   Cardiovascular: Negative for chest pain, palpitations and leg swelling.  Gastrointestinal: Positive for diarrhea. Negative for abdominal  distention, abdominal pain, blood in stool, constipation, nausea and vomiting.  Genitourinary: Negative for difficulty urinating and hematuria.  Musculoskeletal: Negative for arthralgias, myalgias and neck pain.  Skin: Negative for rash.  Neurological: Negative for dizziness, seizures, syncope and headaches.  Hematological: Negative for adenopathy. Does not bruise/bleed easily.  Psychiatric/Behavioral: Negative for dysphoric mood. The patient is not nervous/anxious.     Objective:  BP 108/68 (BP Location: Left Arm, Patient Position: Sitting, Cuff Size: Normal)   Pulse 73   Temp 98 F (36.7 C) (Temporal)   Ht 5' 3.25" (1.607 m)   Wt 137 lb 7 oz (62.3 kg)   SpO2 99%   BMI 24.15 kg/m   Wt Readings from Last 3 Encounters:  02/05/20 137 lb 7 oz (62.3 kg)  12/15/19 137 lb (62.1 kg)  02/02/19 137 lb 8 oz (62.4 kg)      Physical Exam Vitals and nursing note reviewed. Exam conducted with a chaperone present.  Constitutional:      General: She is not in acute distress.    Appearance: Normal appearance. She is well-developed and well-nourished. She is not ill-appearing.  HENT:     Head: Normocephalic and atraumatic.     Right Ear: Hearing, tympanic membrane, ear canal and external ear normal.     Left Ear: Hearing, tympanic membrane, ear canal and external ear normal.     Mouth/Throat:     Mouth: Mucous membranes are normal.     Pharynx: No posterior oropharyngeal edema.  Eyes:     General: No scleral icterus.    Extraocular Movements: Extraocular movements intact and EOM normal.     Conjunctiva/sclera: Conjunctivae normal.     Pupils: Pupils are equal, round, and reactive to light.  Neck:     Thyroid: No thyroid mass or thyromegaly.  Cardiovascular:     Rate and Rhythm: Normal rate and regular rhythm.     Pulses: Normal pulses and intact distal pulses.          Radial pulses are 2+ on the right side and 2+ on the left side.     Heart sounds: Normal heart sounds. No murmur heard.   Pulmonary:     Effort: Pulmonary effort is normal. No respiratory distress.     Breath sounds: Normal breath sounds. No wheezing, rhonchi or rales.  Chest:  Breasts:     Right: Normal. No swelling, bleeding, inverted nipple, mass, nipple discharge, skin change, tenderness, axillary adenopathy or supraclavicular adenopathy.     Left: Normal. No swelling, bleeding, inverted nipple, mass, nipple discharge, skin change, tenderness, axillary adenopathy or  supraclavicular adenopathy.    Abdominal:     General: Abdomen is flat. Bowel sounds are normal. There is no distension.     Palpations: Abdomen is soft. There is no mass.     Tenderness: There is no abdominal tenderness. There is no guarding or rebound.     Hernia: No hernia is present. There is no hernia in the left inguinal area or right inguinal area.  Genitourinary:    Exam position: Supine.     Pubic Area: No rash.      Labia:        Right: No rash.        Left: No rash.      Urethra: No prolapse.     Vagina: Normal.     Cervix: Normal.     Uterus: Normal.      Adnexa: Right adnexa normal and left adnexa normal.  Comments: Pap performed on cervix Musculoskeletal:        General: No edema. Normal range of motion.     Cervical back: Normal range of motion and neck supple.     Right lower leg: No edema.     Left lower leg: No edema.  Lymphadenopathy:     Head:     Right side of head: No submental, submandibular, tonsillar, preauricular or posterior auricular adenopathy.     Left side of head: No submental, submandibular, tonsillar, preauricular or posterior auricular adenopathy.     Cervical: No cervical adenopathy.     Upper Body:     Right upper body: No supraclavicular or axillary adenopathy.     Left upper body: No supraclavicular or axillary adenopathy.  Skin:    General: Skin is warm and dry.     Findings: No rash.  Neurological:     General: No focal deficit present.     Mental Status: She is alert and oriented to person, place, and time.     Comments: CN grossly intact, station and gait intact  Psychiatric:        Mood and Affect: Mood and affect and mood normal.        Behavior: Behavior normal.        Thought Content: Thought content normal.        Judgment: Judgment normal.       Results for orders placed or performed in visit on 02/02/20  VITAMIN D 25 Hydroxy (Vit-D Deficiency, Fractures)  Result Value Ref Range   VITD 46.43 30.00 - 100.00 ng/mL   Comprehensive metabolic panel  Result Value Ref Range   Sodium 138 135 - 145 mEq/L   Potassium 4.1 3.5 - 5.1 mEq/L   Chloride 103 96 - 112 mEq/L   CO2 29 19 - 32 mEq/L   Glucose, Bld 86 70 - 99 mg/dL   BUN 13 6 - 23 mg/dL   Creatinine, Ser 0.71 0.40 - 1.20 mg/dL   Total Bilirubin 0.5 0.2 - 1.2 mg/dL   Alkaline Phosphatase 65 39 - 117 U/L   AST 18 0 - 37 U/L   ALT 13 0 - 35 U/L   Total Protein 6.9 6.0 - 8.3 g/dL   Albumin 4.5 3.5 - 5.2 g/dL   GFR 91.57 >60.00 mL/min   Calcium 9.6 8.4 - 10.5 mg/dL  Lipid panel  Result Value Ref Range   Cholesterol 164 0 - 200 mg/dL   Triglycerides 56.0 0.0 - 149.0 mg/dL   HDL 54.50 >39.00 mg/dL   VLDL 11.2 0.0 - 40.0 mg/dL   LDL Cholesterol 99 0 - 99 mg/dL   Total CHOL/HDL Ratio 3    NonHDL 109.97    Assessment & Plan:  This visit occurred during the SARS-CoV-2 public health emergency.  Safety protocols were in place, including screening questions prior to the visit, additional usage of staff PPE, and extensive cleaning of exam room while observing appropriate contact time as indicated for disinfecting solutions.   Problem List Items Addressed This Visit    Vitamin D deficiency    Continue regular vit D replacement       Recurrent UTI    macrobid postcoitally.       Hyperlipidemia    Great control over the last 3 years.  Pt desires yearly screen given h/o abnormal in the past. The 10-year ASCVD risk score Mikey Bussing DC Jr., et al., 2013) is: 2.3%   Values used to calculate the score:  Age: 50 years     Sex: Female     Is Non-Hispanic African American: No     Diabetic: No     Tobacco smoker: No     Systolic Blood Pressure: 123XX123 mmHg     Is BP treated: No     HDL Cholesterol: 54.5 mg/dL     Total Cholesterol: 164 mg/dL       Healthcare maintenance - Primary    Preventative protocols reviewed and updated unless pt declined. Discussed healthy diet and lifestyle.        Other Visit Diagnoses    Encounter for annual routine  gynecological examination       Relevant Orders   Cytology - PAP       No orders of the defined types were placed in this encounter.  No orders of the defined types were placed in this encounter.   Follow up plan: Return in about 1 year (around 02/04/2021) for annual exam, prior fasting for blood work.  Ria Bush, MD

## 2020-02-05 NOTE — Assessment & Plan Note (Signed)
Continue regular vit D replacement

## 2020-02-05 NOTE — Assessment & Plan Note (Signed)
Great control over the last 3 years.  Pt desires yearly screen given h/o abnormal in the past. The 10-year ASCVD risk score Mikey Bussing DC Brooke Bonito., et al., 2013) is: 2.3%   Values used to calculate the score:     Age: 60 years     Sex: Female     Is Non-Hispanic African American: No     Diabetic: No     Tobacco smoker: No     Systolic Blood Pressure: 224 mmHg     Is BP treated: No     HDL Cholesterol: 54.5 mg/dL     Total Cholesterol: 164 mg/dL

## 2020-02-05 NOTE — Assessment & Plan Note (Signed)
Preventative protocols reviewed and updated unless pt declined. Discussed healthy diet and lifestyle.  

## 2020-02-09 LAB — CYTOLOGY - PAP
Comment: NEGATIVE
Diagnosis: NEGATIVE
High risk HPV: NEGATIVE

## 2020-05-20 ENCOUNTER — Other Ambulatory Visit: Payer: Self-pay | Admitting: Family Medicine

## 2020-05-20 DIAGNOSIS — Z1231 Encounter for screening mammogram for malignant neoplasm of breast: Secondary | ICD-10-CM

## 2020-06-28 ENCOUNTER — Ambulatory Visit
Admission: RE | Admit: 2020-06-28 | Discharge: 2020-06-28 | Disposition: A | Discharge status: Home / Self Care | Payer: No Typology Code available for payment source | Source: Ambulatory Visit | Attending: Family Medicine | Admitting: Family Medicine

## 2020-06-28 ENCOUNTER — Other Ambulatory Visit: Payer: Self-pay

## 2020-06-28 DIAGNOSIS — Z1231 Encounter for screening mammogram for malignant neoplasm of breast: Secondary | ICD-10-CM

## 2021-01-28 ENCOUNTER — Other Ambulatory Visit: Payer: Self-pay | Admitting: Family Medicine

## 2021-01-28 DIAGNOSIS — E785 Hyperlipidemia, unspecified: Secondary | ICD-10-CM

## 2021-01-28 DIAGNOSIS — E559 Vitamin D deficiency, unspecified: Secondary | ICD-10-CM

## 2021-02-02 ENCOUNTER — Telehealth: Payer: Self-pay

## 2021-02-02 NOTE — Telephone Encounter (Signed)
[name] - Client Nonclinical Telephone Record  [facility] [name] Night - Client Client Site [place] - Night Contact Type Call Who Is Calling Patient / Member / Family / Caregiver Caller Name Outlook Phone Number [phone] Call Type Message Only Information Provided Reason for Call Returning a Call from the Office Initial [name] states she has labs tomorrow at 730 and was returning a call from [name]. Additional Comment Office hours provided. Disp. Time Disposition Final User 02/02/2021 7:48:08 AM General Information Provided Yes Melanee Spry Call Closed By: Melanee Spry Transaction Date/Time: 02/02/2021 7:46:52 AM (ET

## 2021-02-03 ENCOUNTER — Other Ambulatory Visit: Payer: No Typology Code available for payment source

## 2021-02-10 ENCOUNTER — Ambulatory Visit (INDEPENDENT_AMBULATORY_CARE_PROVIDER_SITE_OTHER): Payer: No Typology Code available for payment source | Admitting: Family Medicine

## 2021-02-10 ENCOUNTER — Other Ambulatory Visit: Payer: Self-pay

## 2021-02-10 ENCOUNTER — Encounter: Payer: Self-pay | Admitting: Family Medicine

## 2021-02-10 VITALS — BP 116/72 | HR 62 | Temp 98.3°F | Ht 63.0 in | Wt 138.4 lb

## 2021-02-10 DIAGNOSIS — E785 Hyperlipidemia, unspecified: Secondary | ICD-10-CM

## 2021-02-10 DIAGNOSIS — E559 Vitamin D deficiency, unspecified: Secondary | ICD-10-CM

## 2021-02-10 DIAGNOSIS — Z780 Asymptomatic menopausal state: Secondary | ICD-10-CM

## 2021-02-10 DIAGNOSIS — R233 Spontaneous ecchymoses: Secondary | ICD-10-CM | POA: Diagnosis not present

## 2021-02-10 DIAGNOSIS — Z0000 Encounter for general adult medical examination without abnormal findings: Secondary | ICD-10-CM

## 2021-02-10 DIAGNOSIS — Z1211 Encounter for screening for malignant neoplasm of colon: Secondary | ICD-10-CM | POA: Diagnosis not present

## 2021-02-10 LAB — CBC WITH DIFFERENTIAL/PLATELET
Basophils Absolute: 0 10*3/uL (ref 0.0–0.1)
Basophils Relative: 1.1 % (ref 0.0–3.0)
Eosinophils Absolute: 0.1 10*3/uL (ref 0.0–0.7)
Eosinophils Relative: 2.4 % (ref 0.0–5.0)
HCT: 37 % (ref 36.0–46.0)
Hemoglobin: 12.3 g/dL (ref 12.0–15.0)
Lymphocytes Relative: 42.2 % (ref 12.0–46.0)
Lymphs Abs: 1.7 10*3/uL (ref 0.7–4.0)
MCHC: 33.3 g/dL (ref 30.0–36.0)
MCV: 87.3 fl (ref 78.0–100.0)
Monocytes Absolute: 0.2 10*3/uL (ref 0.1–1.0)
Monocytes Relative: 5.8 % (ref 3.0–12.0)
Neutro Abs: 1.9 10*3/uL (ref 1.4–7.7)
Neutrophils Relative %: 48.5 % (ref 43.0–77.0)
Platelets: 270 10*3/uL (ref 150.0–400.0)
RBC: 4.24 Mil/uL (ref 3.87–5.11)
RDW: 13.1 % (ref 11.5–15.5)
WBC: 4 10*3/uL (ref 4.0–10.5)

## 2021-02-10 LAB — COMPREHENSIVE METABOLIC PANEL
ALT: 16 U/L (ref 0–35)
AST: 19 U/L (ref 0–37)
Albumin: 4.3 g/dL (ref 3.5–5.2)
Alkaline Phosphatase: 62 U/L (ref 39–117)
BUN: 12 mg/dL (ref 6–23)
CO2: 30 mEq/L (ref 19–32)
Calcium: 9.7 mg/dL (ref 8.4–10.5)
Chloride: 104 mEq/L (ref 96–112)
Creatinine, Ser: 0.75 mg/dL (ref 0.40–1.20)
GFR: 85.12 mL/min (ref >60.00)
Glucose, Bld: 85 mg/dL (ref 70–99)
Potassium: 4.3 mEq/L (ref 3.5–5.1)
Sodium: 140 mEq/L (ref 135–145)
Total Bilirubin: 0.4 mg/dL (ref 0.2–1.2)
Total Protein: 7.1 g/dL (ref 6.0–8.3)

## 2021-02-10 LAB — LIPID PANEL
Cholesterol: 145 mg/dL (ref 0–200)
HDL: 53.9 mg/dL (ref >39.00)
LDL Cholesterol: 81 mg/dL (ref 0–99)
NonHDL: 91.26
Total CHOL/HDL Ratio: 3
Triglycerides: 51 mg/dL (ref 0.0–149.0)
VLDL: 10.2 mg/dL (ref 0.0–40.0)

## 2021-02-10 LAB — VITAMIN D 25 HYDROXY (VIT D DEFICIENCY, FRACTURES): VITD: 41.97 ng/mL (ref 30.00–100.00)

## 2021-02-10 NOTE — Assessment & Plan Note (Signed)
Update levels off statin. The 10-year ASCVD risk score (Arnett DK, et al., 2019) is: 3%   Values used to calculate the score:     Age: 61 years     Sex: Female     Is Non-Hispanic African American: No     Diabetic: No     Tobacco smoker: No     Systolic Blood Pressure: 009 mmHg     Is BP treated: No     HDL Cholesterol: 54.5 mg/dL     Total Cholesterol: 164 mg/dL

## 2021-02-10 NOTE — Assessment & Plan Note (Signed)
Preventative protocols reviewed and updated unless pt declined. Discussed healthy diet and lifestyle.  

## 2021-02-10 NOTE — Patient Instructions (Addendum)
Pass by lab to pick up stool kit.  Labs today

## 2021-02-10 NOTE — Assessment & Plan Note (Addendum)
Update levels on regular replacement 

## 2021-02-10 NOTE — Progress Notes (Signed)
Patient ID: Kathy Pugh, female    DOB: 1960-06-20, 61 y.o.   MRN: [id number]  This visit was conducted in person.  BP 116/72    Pulse 62    Temp 98.3 F (36.8 C) (Temporal)    Ht '5\' 3"'  (1.6 m)    Wt 138 lb 6 oz (62.8 kg)    SpO2 100%    BMI 24.51 kg/m    CC: CPE Subjective:   HPI: Kathy Pugh is a 61 y.o. female presenting on 02/10/2021 for Annual Exam   Went on trip to [place] 03/2020.  COVID 05/2020 - fully recovered.  Mother found to have mass in heart - she wants to avoid heart surgery. Uncertain 6295.   Preventative: COLONOSCOPY Date: 01/2004 normal [name]). Stool kits yearly since, normal. Latest 10/2019.  [facility] - Birads1 [facility]. Does SBE at home without concerns. Will do CBE QOY with pelvic.  Well woman exam - paps always normal, last 02/2020.  Mom with ?h/o uterine cancer, abnormal cells that led to hysterectomy. Given fm hx requests q2 yrs.  Menopause ~2003 at age 54. Off angeliq since 2018. Was on this for hot flashes and vag dryness.  Bone density - discussed cal, vit D in diet, and weight bearing exercise. Good with calcium intake. this year wants to walk 2 miles daily.  Lung cancer screen - not eligible  Flu shot at work.  COVID vaccine Pfizer 03/2019, 04/2019, booster 10/2019, bivalen 02/2021  Tdap 11/2012  shingrix - 11/2018, 05/2019 Seat belt use discussed  Sunscreen use discussed. No suspicious moles on skin.  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 Yogurt for breakfast, 1 cup milk/day      Relevant past medical, surgical, family and social history reviewed  and updated as indicated. Interim medical history since our last visit reviewed. Allergies and medications reviewed and updated. Outpatient Medications Prior to Visit  Medication Sig Dispense Refill   cetirizine (ZYRTEC) 10 MG tablet Take 10 mg by mouth daily.     Cholecalciferol (VITAMIN D3) 50 MCG (2000 UT) TABS Take by mouth daily.     COD LIVER OIL PO Take 1 capsule by mouth daily.     Cyanocobalamin (VITAMIN B-12) 1000 MCG SUBL Place 1 tablet (1,000 mcg total) under the tongue daily. 1500 mcg  0   vitamin C (ASCORBIC ACID) 500 MG tablet Take 500 mg by mouth daily.     vitamin E 180 MG (400 UNITS) capsule Take 400 Units by mouth daily.     nitrofurantoin, macrocrystal-monohydrate, (MACROBID) 100 MG capsule TAKE 1 CAPSULE (100 MG TOTAL) BY MOUTH DAILY AS NEEDED. 10 capsule 6   No facility-administered medications prior to visit.     Per HPI unless specifically indicated in ROS section below Review of Systems  Constitutional:  Negative for activity change, appetite change, chills, fatigue, fever and unexpected weight change.  HENT:  Negative for hearing loss.   Eyes:  Negative for visual disturbance.  Respiratory:  Negative for cough, chest tightness, shortness of breath and wheezing.  Cardiovascular:  Negative for chest pain, palpitations and leg swelling.  Gastrointestinal:  Negative for abdominal distention, abdominal pain, blood in stool, constipation, diarrhea, nausea and vomiting.  Genitourinary:  Negative for difficulty urinating and hematuria.  Musculoskeletal:  Negative for arthralgias, myalgias and neck pain.  Skin:  Negative for rash.  Neurological:  Negative for dizziness, seizures, syncope and headaches.  Hematological:  Negative for adenopathy. Bruises/bleeds easily.  Psychiatric/Behavioral:  Negative for dysphoric mood. The patient is not nervous/anxious.    Objective:  BP 116/72    Pulse 62    Temp 98.3 F (36.8 C) (Temporal)    Ht '5\' 3"'  (1.6 m)    Wt 138 lb 6 oz  (62.8 kg)    SpO2 100%    BMI 24.51 kg/m   Wt Readings from Last 3 Encounters:  02/10/21 138 lb 6 oz (62.8 kg)  02/05/20 137 lb 7 oz (62.3 kg)  12/15/19 137 lb (62.1 kg)      Physical Exam Vitals and nursing note reviewed.  Constitutional:      Appearance: Normal appearance. She is not ill-appearing.  HENT:     Head: Normocephalic and atraumatic.     Right Ear: Tympanic membrane, ear canal and external ear normal. There is no impacted cerumen.     Left Ear: Tympanic membrane, ear canal and external ear normal. There is no impacted cerumen.  Eyes:     General:        Right eye: No discharge.        Left eye: No discharge.     Extraocular Movements: Extraocular movements intact.     Conjunctiva/sclera: Conjunctivae normal.     Pupils: Pupils are equal, round, and reactive to light.  Neck:     Thyroid: No thyroid mass or thyromegaly.  Cardiovascular:     Rate and Rhythm: Normal rate and regular rhythm.     Pulses: Normal pulses.     Heart sounds: Normal heart sounds. No murmur heard. Pulmonary:     Effort: Pulmonary effort is normal. No respiratory distress.     Breath sounds: Normal breath sounds. No wheezing, rhonchi or rales.  Abdominal:     General: Bowel sounds are normal. There is no distension.     Palpations: Abdomen is soft. There is no mass.     Tenderness: There is no abdominal tenderness. There is no guarding or rebound.     Hernia: No hernia is present.  Musculoskeletal:     Cervical back: Normal range of motion and neck supple. No rigidity.     Right lower leg: No edema.     Left lower leg: No edema.  Lymphadenopathy:     Cervical: No cervical adenopathy.  Skin:    General: Skin is warm and dry.     Findings: No rash.  Neurological:     General: No focal deficit present.     Mental Status: She is alert. Mental status is at baseline.  Psychiatric:        Mood and Affect: Mood normal.        Behavior: Behavior normal.      Results for orders placed or  performed in visit on 02/05/20  Cytology - PAP  Result Value Ref Range   High risk HPV Negative    Adequacy      Satisfactory for evaluation; transformation zone component PRESENT.   Diagnosis      - Negative for intraepithelial lesion or malignancy (NILM)   Comment Normal Reference Range  HPV - Negative     Assessment & Plan:  This visit occurred during the SARS-CoV-2 public health emergency.  Safety protocols were in place, including screening questions prior to the visit, additional usage of staff PPE, and extensive cleaning of exam room while observing appropriate contact time as indicated for disinfecting solutions.   Problem List Items Addressed This Visit     Healthcare maintenance - Primary (Chronic)    Preventative protocols reviewed and updated unless pt declined. Discussed healthy diet and lifestyle.       Vitamin D deficiency    Update levels on regular replacement.       Hyperlipidemia    Update levels off statin. The 10-year ASCVD risk score (Arnett DK, et al., 2019) is: 3%   Values used to calculate the score:     Age: 65 years     Sex: Female     Is Non-Hispanic African American: No     Diabetic: No     Tobacco smoker: No     Systolic Blood Pressure: 773 mmHg     Is BP treated: No     HDL Cholesterol: 54.5 mg/dL     Total Cholesterol: 164 mg/dL       Postmenopausal    Regular weight bearing exercise, good calcium and vit D intake.       Other Visit Diagnoses     Special screening for malignant neoplasms, colon       Relevant Orders   Fecal occult blood, imunochemical   Easy bruising       Relevant Orders   CBC with Differential/Platelet        No orders of the defined types were placed in this encounter.  Orders Placed This Encounter  Procedures   Fecal occult blood, imunochemical    Standing Status:   Future    Standing Expiration Date:   02/10/2022   CBC with Differential/Platelet     Patient Instructions  Pass by lab to pick up  stool kit.  Labs today   Follow up plan: Return in about 1 year (around 02/10/2022), or if symptoms worsen or fail to improve, for annual exam, prior fasting for blood work.  Ria Bush, MD

## 2021-02-10 NOTE — Assessment & Plan Note (Signed)
Regular weight bearing exercise, good calcium and vit D intake.

## 2021-02-10 NOTE — Assessment & Plan Note (Signed)
No longer on macrobid.

## 2021-02-14 ENCOUNTER — Other Ambulatory Visit (INDEPENDENT_AMBULATORY_CARE_PROVIDER_SITE_OTHER): Payer: No Typology Code available for payment source

## 2021-02-14 ENCOUNTER — Other Ambulatory Visit: Payer: Self-pay

## 2021-02-14 DIAGNOSIS — Z1211 Encounter for screening for malignant neoplasm of colon: Secondary | ICD-10-CM

## 2021-02-15 ENCOUNTER — Telehealth: Payer: Self-pay | Admitting: *Deleted

## 2021-02-15 ENCOUNTER — Other Ambulatory Visit: Payer: Self-pay | Admitting: Family Medicine

## 2021-02-15 DIAGNOSIS — Z1211 Encounter for screening for malignant neoplasm of colon: Secondary | ICD-10-CM

## 2021-02-15 DIAGNOSIS — R195 Other fecal abnormalities: Secondary | ICD-10-CM

## 2021-02-15 LAB — FECAL OCCULT BLOOD, IMMUNOCHEMICAL: Fecal Occult Bld: POSITIVE — AB

## 2021-02-15 NOTE — Telephone Encounter (Signed)
Kathy Pugh called. Margarita Grizzle let us know pt has positive Ifob.  Will route to PCP and also Eugenia Pancoast, FNP since PCP is out of the office this week

## 2021-02-15 NOTE — Telephone Encounter (Signed)
This has been addressed. Thanks.

## 2021-02-16 ENCOUNTER — Encounter: Payer: Self-pay | Admitting: Gastroenterology

## 2021-03-20 ENCOUNTER — Ambulatory Visit (AMBULATORY_SURGERY_CENTER): Payer: No Typology Code available for payment source | Admitting: *Deleted

## 2021-03-20 ENCOUNTER — Other Ambulatory Visit: Payer: Self-pay

## 2021-03-20 VITALS — Ht 63.0 in | Wt 137.0 lb

## 2021-03-20 DIAGNOSIS — R195 Other fecal abnormalities: Secondary | ICD-10-CM

## 2021-03-20 MED ORDER — NA SULFATE-K SULFATE-MG SULF 17.5-3.13-1.6 GM/177ML PO SOLN: 2.0000 | Freq: Once | ORAL | 0 refills | Status: AC

## 2021-03-20 NOTE — Progress Notes (Signed)

## 2021-03-31 ENCOUNTER — Encounter: Payer: Self-pay | Admitting: Gastroenterology

## 2021-04-01 HISTORY — PX: COLONOSCOPY: SHX174

## 2021-04-03 ENCOUNTER — Ambulatory Visit (AMBULATORY_SURGERY_CENTER): Payer: No Typology Code available for payment source | Admitting: Gastroenterology

## 2021-04-03 ENCOUNTER — Encounter: Payer: Self-pay | Admitting: Gastroenterology

## 2021-04-03 VITALS — BP 99/65 | HR 68 | Temp 97.5°F | Resp 16 | Ht 63.0 in | Wt 137.0 lb

## 2021-04-03 DIAGNOSIS — K635 Polyp of colon: Secondary | ICD-10-CM

## 2021-04-03 DIAGNOSIS — K648 Other hemorrhoids: Secondary | ICD-10-CM | POA: Diagnosis not present

## 2021-04-03 DIAGNOSIS — R195 Other fecal abnormalities: Secondary | ICD-10-CM

## 2021-04-03 DIAGNOSIS — D122 Benign neoplasm of ascending colon: Secondary | ICD-10-CM

## 2021-04-03 MED ORDER — SODIUM CHLORIDE 0.9 % IV SOLN: 500.0000 mL | Freq: Once | INTRAVENOUS | Status: DC

## 2021-04-03 NOTE — Patient Instructions (Signed)
Handout on polyps and hemorrhoids  provided  ? ?Await pathology results.  ? ?Continue current medications.  ? ?YOU HAD AN ENDOSCOPIC PROCEDURE TODAY AT [facility]:   Refer to the procedure report that was given to you for any specific questions about what was found during the examination.  If the procedure report does not answer your questions, please call your gastroenterologist to clarify.  If you requested that your care partner not be given the details of your procedure findings, then the procedure report has been included in a sealed envelope for you to review at your convenience later. ? ?YOU SHOULD EXPECT: Some feelings of bloating in the abdomen. Passage of more gas than usual.  Walking can help get rid of the air that was put into your GI tract during the procedure and reduce the bloating. If you had a lower endoscopy (such as a colonoscopy or flexible sigmoidoscopy) you may notice spotting of blood in your stool or on the toilet paper. If you underwent a bowel prep for your procedure, you may not have a normal bowel movement for a few days. ? ?Please Note:  You might notice some irritation and congestion in your nose or some drainage.  This is from the oxygen used during your procedure.  There is no need for concern and it should clear up in a day or so. ? ?SYMPTOMS TO REPORT IMMEDIATELY: ? ?Following lower endoscopy (colonoscopy or flexible sigmoidoscopy): ? Excessive amounts of blood in the stool ? Significant tenderness or worsening of abdominal pains ? Swelling of the abdomen that is new, acute ? Fever of 100?F or higher ? ?For urgent or emergent issues, a gastroenterologist can be reached at any hour by calling [phone]. ?Do not use MyChart messaging for urgent concerns.  ? ? ?DIET:  We do recommend a small meal at first, but then you may proceed to your regular diet.  Drink plenty of fluids but you should avoid alcoholic beverages for 24 hours. ? ?ACTIVITY:  You should plan to  take it easy for the rest of today and you should NOT DRIVE or use heavy machinery until tomorrow (because of the sedation medicines used during the test).   ? ?FOLLOW UP: ?Our staff will call the number listed on your records 48-72 hours following your procedure to check on you and address any questions or concerns that you may have regarding the information given to you following your procedure. If we do not reach you, we will leave a message.  We will attempt to reach you two times.  During this call, we will ask if you have developed any symptoms of COVID 19. If you develop any symptoms (ie: fever, flu-like symptoms, shortness of breath, cough etc.) before then, please call [phone].  If you test positive for Covid 19 in the 2 weeks post procedure, please call and report this information to [place].   ? ?If any biopsies were taken you will be contacted by phone or by letter within the next 1-3 weeks.  Please call us at [phone] if you have not heard about the biopsies in 3 weeks.  ? ? ?SIGNATURES/CONFIDENTIALITY: ?You and/or your care partner have signed paperwork which will be entered into your electronic medical record.  These signatures attest to the fact that that the information above on your After Visit Summary has been reviewed and is understood.  Full responsibility of the confidentiality of this discharge information lies with you and/or your care-partner. ? ?

## 2021-04-03 NOTE — Progress Notes (Signed)
? ?Referring Provider: Ria Bush, MD ?Primary Care Physician:  Ria Bush, MD ? ?Indication for Procedure:  Heme + stools ? ? ?IMPRESSION:  ?Heme + stools ?Appropriate candidate for monitored anesthesia care ? ?PLAN: ?Colonoscopy in the [place] today ? ? ?HPI: Kathy Pugh is a 61 y.o. female presents for surveillance colonoscopy with Heme + stools. ? ?Prior endoscopic history: ?Normal colonoscopy 2006 for hematochezia ? ?Has been doing stool studies for colon cancer screening since her last colonoscopy. ? ?No baseline GI symptoms except for hemorrhoids.  ? ?No known family history of colon cancer or polyps. No family history of uterine/endometrial cancer, pancreatic cancer or gastric/stomach cancer. ? ? ?Past Medical History:  ?Diagnosis Date  ? GERD (gastroesophageal reflux disease) 2005  ? with esophageal stricture and HH per EGD  ? Headache(784.0)   ? frequent, takes tylenol  ? History of chicken pox   ? Hx: UTI (urinary tract infection)   ? trimethoprim ppx  ? Irritable bowel syndrome with diarrhea 2005  ? per GI, Dr. Sharlett Pugh  ? Migraines   ? none in years  ? Seasonal allergies   ? Vitamin D deficiency   ? ? ?Past Surgical History:  ?Procedure Laterality Date  ? BREAST BIOPSY Left   ? COLONOSCOPY  01/2004  ? normal Kathy Pugh)  ? ESOPHAGOGASTRODUODENOSCOPY  10/2003  ? GERD, HH, esoph stricture, neg H pylori  ? HAMMER TOE SURGERY  11/2009  ? left foot - hammer toe and bunion  ? laparoscopic uterine nerve ablation  1998  ? for endometriosis  ? TONSILLECTOMY AND ADENOIDECTOMY  1983  ? TUBAL LIGATION    ? ? ?Current Outpatient Medications  ?Medication Sig Dispense Refill  ? cetirizine (ZYRTEC) 10 MG tablet Take 10 mg by mouth daily.    ? Cholecalciferol (VITAMIN D3) 50 MCG (2000 UT) TABS Take by mouth daily.    ? COD LIVER OIL PO Take 1 capsule by mouth daily.    ? Cyanocobalamin (VITAMIN B-12) 1000 MCG SUBL Place 1 tablet (1,000 mcg total) under the tongue daily. 1500 mcg  0  ? vitamin C (ASCORBIC  ACID) 500 MG tablet Take 500 mg by mouth daily.    ? vitamin E 180 MG (400 UNITS) capsule Take 400 Units by mouth daily.    ? ?Current Facility-Administered Medications  ?Medication Dose Route Frequency Provider Last Rate Last Admin  ? 0.9 %  sodium chloride infusion  500 mL Intravenous Once Kathy Park, MD      ? ? ?Allergies as of 04/03/2021 - Review Complete 04/03/2021  ?Allergen Reaction Noted  ? Tramadol Nausea Only 02/02/2019  ? Vicodin [hydrocodone-acetaminophen] Nausea Only 08/17/2010  ? ? ?Family History  ?Problem Relation Age of Onset  ? Hyperlipidemia Mother 23  ?     hysterectomy for abnormal cells  ? Heart Problems Mother 62  ?     cardiac myxoma  ? Diabetes Father   ? Coronary artery disease Father 66  ? Hypertension Sister   ? Diabetes Sister   ? Stroke Maternal Grandfather   ? Cancer Neg Hx   ?     ? mother with uterine  ? Breast cancer Neg Hx   ? Colon cancer Neg Hx   ? Colon polyps Neg Hx   ? Esophageal cancer Neg Hx   ? Stomach cancer Neg Hx   ? Rectal cancer Neg Hx   ? ? ? ?Physical Exam: ?General:   Alert,  well-nourished, pleasant and cooperative in NAD ?Head:  Normocephalic and atraumatic. ?Eyes:  Sclera clear, no icterus.   Conjunctiva pink. ?Mouth:  No deformity or lesions.   ?Neck:  Supple; no masses or thyromegaly. ?Lungs:  Clear throughout to auscultation.   No wheezes. ?Heart:  Regular rate and rhythm; no murmurs. ?Abdomen:  Soft, non-tender, nondistended, normal bowel sounds, no rebound or guarding.  ?Msk:  Symmetrical. No boney deformities ?LAD: No inguinal or umbilical LAD ?Extremities:  No clubbing or edema. ?Neurologic:  Alert and  oriented x4;  grossly nonfocal ?Skin:  No obvious rash or bruise. ?Psych:  Alert and cooperative. Normal mood and affect. ? ? ? ? ?Studies/Results: ?No results found. ? ? ? ?Kathy Sainato L. Tarri Glenn, MD, MPH ?04/03/2021, 8:06 AM ? ? ?  ?

## 2021-04-03 NOTE — Progress Notes (Signed)
Called to room to assist during endoscopic procedure.  Patient ID and intended procedure confirmed with present staff. Received instructions for my participation in the procedure from the performing physician.  

## 2021-04-03 NOTE — Progress Notes (Signed)
Report to PACU, RN, vss, BBS= Clear.  

## 2021-04-03 NOTE — Op Note (Signed)
[facility] ?Patient Name: Kathy Pugh ?Procedure Date: 04/03/2021 7:01 AM ?MRN: [id number] ?Endoscopist: Thornton Park MD, MD ?Age: 61 ?Referring MD:  ?Date of Birth: Dec 24, 1960 ?Gender: Female ?Account #: [id number] ?Procedure:                Colonoscopy ?Indications:              Heme positive stool ?                          No known family history of colon cancer or polyps ?Medicines:                Monitored Anesthesia Care ?Procedure:                Pre-Anesthesia Assessment: ?                          - Prior to the procedure, a History and Physical  ?                          was performed, and patient medications and  ?                          allergies were reviewed. The patient's tolerance of  ?                          previous anesthesia was also reviewed. The risks  ?                          and benefits of the procedure and the sedation  ?                          options and risks were discussed with the patient.  ?                          All questions were answered, and informed consent  ?                          was obtained. Prior Anticoagulants: The patient has  ?                          taken no previous anticoagulant or antiplatelet  ?                          agents. ASA Grade Assessment: I - A normal, healthy  ?                          patient. After reviewing the risks and benefits,  ?                          the patient was deemed in satisfactory condition to  ?                          undergo the procedure. ?  After obtaining informed consent, the colonoscope  ?                          was passed under direct vision. Throughout the  ?                          procedure, the patient's blood pressure, pulse, and  ?                          oxygen saturations were monitored continuously. The  ?                          Olympus CF-[id number] (#[id number]) Colonoscope was  ?                          introduced through the anus and advanced to the 3  ?                           cm into the ileum. The colonoscopy was performed  ?                          without difficulty. The patient tolerated the  ?                          procedure well. The quality of the bowel  ?                          preparation was good. The terminal ileum, ileocecal  ?                          valve, appendiceal orifice, and rectum were  ?                          photographed. ?Scope In: 8:16:54 AM ?Scope Out: 8:30:51 AM ?Scope Withdrawal Time: 0 hours 10 minutes 4 seconds  ?Total Procedure Duration: 0 hours 13 minutes 57 seconds  ?Findings:                 Non-bleeding external and internal hemorrhoids were  ?                          found. ?                          A 4 mm polyp was found in the proximal ascending  ?                          colon. The polyp was flat. The polyp was removed  ?                          with a cold snare. Resection and retrieval were  ?                          complete. Estimated blood loss was minimal. ?  The exam was otherwise without abnormality on  ?                          direct and retroflexion views. ?Complications:            No immediate complications. ?Estimated Blood Loss:     Estimated blood loss was minimal. ?Impression:               - Non-bleeding external and internal hemorrhoids. ?                          - One 4 mm polyp in the proximal ascending colon,  ?                          removed with a cold snare. Resected and retrieved. ?                          - The examination was otherwise normal on direct  ?                          and retroflexion views. ?Recommendation:           - Patient has a contact number available for  ?                          emergencies. The signs and symptoms of potential  ?                          delayed complications were discussed with the  ?                          patient. Return to normal activities tomorrow.  ?                          Written discharge instructions were  provided to the  ?                          patient. ?                          - Resume previous diet. ?                          - Continue present medications. ?                          - Await pathology results. ?                          - Repeat colonoscopy date to be determined after  ?                          pending pathology results are reviewed for  ?                          surveillance. ?                          -  Emerging evidence supports eating a diet of  ?                          fruits, vegetables, grains, calcium, and yogurt  ?                          while reducing red meat and alcohol may reduce the  ?                          risk of colon cancer. ?                          - Thank you for allowing me to be involved in your  ?                          colon cancer prevention. ?Thornton Park MD, MD ?04/03/2021 8:36:23 AM ?This report has been signed electronically. ?

## 2021-04-03 NOTE — Progress Notes (Signed)
Pt's states no medical or surgical changes since previsit or office visit. 

## 2021-04-05 ENCOUNTER — Telehealth: Payer: Self-pay

## 2021-04-05 ENCOUNTER — Telehealth: Payer: Self-pay | Admitting: *Deleted

## 2021-04-05 NOTE — Telephone Encounter (Signed)
Attempted f/u call. No answer, left VM. 

## 2021-04-05 NOTE — Telephone Encounter (Signed)
?  Follow up Call- ? ? ?  04/03/2021  ?  7:08 AM  ?Call back number  ?Post procedure Call Back phone  # [phone]  ?Permission to leave phone message Yes  ?  ? ?Patient questions: ? ?Do you have a fever, pain , or abdominal swelling? No. ?Pain Score  0 * ? ?Have you tolerated food without any problems? Yes.   ? ?Have you been able to return to your normal activities? Yes.   ? ?Do you have any questions about your discharge instructions: ?Diet   No. ?Medications  No. ?Follow up visit  No. ? ?Do you have questions or concerns about your Care? No. ? ?Actions: ?* If pain score is 4 or above: ?No action needed, pain <4. ? ? ?

## 2021-04-08 ENCOUNTER — Encounter: Payer: Self-pay | Admitting: Gastroenterology

## 2021-05-16 ENCOUNTER — Other Ambulatory Visit: Payer: Self-pay | Admitting: Family Medicine

## 2021-05-16 DIAGNOSIS — Z1231 Encounter for screening mammogram for malignant neoplasm of breast: Secondary | ICD-10-CM

## 2021-06-29 ENCOUNTER — Ambulatory Visit: Admission: RE | Admit: 2021-06-29 | Payer: No Typology Code available for payment source | Source: Ambulatory Visit

## 2021-06-29 ENCOUNTER — Ambulatory Visit
Admission: RE | Admit: 2021-06-29 | Discharge: 2021-06-29 | Disposition: A | Discharge status: Home / Self Care | Payer: No Typology Code available for payment source | Source: Ambulatory Visit | Attending: Family Medicine | Admitting: Family Medicine

## 2021-06-29 DIAGNOSIS — Z1231 Encounter for screening mammogram for malignant neoplasm of breast: Secondary | ICD-10-CM

## 2021-07-26 ENCOUNTER — Ambulatory Visit (INDEPENDENT_AMBULATORY_CARE_PROVIDER_SITE_OTHER): Payer: No Typology Code available for payment source | Admitting: Nurse Practitioner

## 2021-07-26 VITALS — BP 100/60 | HR 77 | Temp 97.6°F | Resp 12 | Wt 140.0 lb

## 2021-07-26 DIAGNOSIS — G8929 Other chronic pain: Secondary | ICD-10-CM | POA: Insufficient documentation

## 2021-07-26 DIAGNOSIS — M79605 Pain in left leg: Secondary | ICD-10-CM | POA: Insufficient documentation

## 2021-07-26 MED ORDER — KETOROLAC TROMETHAMINE 60 MG/2ML IM SOLN
60.0000 mg | Freq: Once | INTRAMUSCULAR | Status: AC
  Administered 2021-07-26: 60 mg via INTRAMUSCULAR

## 2021-07-26 MED ORDER — CYCLOBENZAPRINE HCL 5 MG PO TABS: 5.0000 mg | ORAL_TABLET | Freq: Two times a day (BID) | ORAL | 0 refills | Status: DC | PRN

## 2021-07-26 NOTE — Assessment & Plan Note (Signed)
Administer Toradol 60 mg IM x1 dose in office.  Patient given precautions to avoid NSAIDs for the next 12 hours.  Patient also prescribed cyclobenzaprine 5 mg twice daily as needed.  Sedation precautions reviewed.  Patient does not get better over the next couple days she will schedule an appoint with her sports medicine physician.  Do recommend patient take the next 2 days off of work to allow rest and healing.  Work excuse given at discharge

## 2021-07-26 NOTE — Progress Notes (Signed)
Acute Office Visit  Subjective:     Patient ID: Kathy Pugh, female    DOB: October 25, 1960, 61 y.o.   MRN: [id number]  Chief Complaint  Patient presents with   Leg Pain    Left calf area, started 2 weeks ago after playing basketball and boweling but not severe, then she played basketball again 07/22/21. Now pain is severe-throbbing sensation. Has taking Aleve and Advil, has also applied Biofreeze and [employer].     Patient is in today for Leg Pain  Started approx 2 weeks ago states that she noticed it after she palyed basketball with her grandsons. States she went bowling with the church later in the week and then the following weekend she played basketball with her grandsons again.  States it was not bothering her until after the last time she played basketball with her grandchildren  States that Monday, she walks at work. States that it about "took her breath away". States if she goes down steps she is unable to lead with the left leg due to pain has tried aleve and advil, aspercreme and bio freeze. States that took aleve this morning.  States the pain is a throbbing pain that is like a tooth ache  Patient denies personal history of DVT, PE, any recent travel of 4 hours or more without breaks, or recent surgery.    Review of Systems  Constitutional:  Negative for chills and fever.  Musculoskeletal:  Positive for joint pain.  Neurological:  Positive for weakness. Negative for tingling.        Objective:    BP 100/60   Pulse 77   Temp 97.6 F (36.4 C)   Resp 12   Wt 140 lb (63.5 kg)   SpO2 97%   BMI 24.80 kg/m    Physical Exam Vitals and nursing note reviewed.  Constitutional:      Appearance: Normal appearance.  Cardiovascular:     Rate and Rhythm: Normal rate and regular rhythm.     Pulses:          Dorsalis pedis pulses are 2+ on the right side and 2+ on the left side.       Posterior tibial pulses are 2+ on the right side and 2+ on the left side.     Heart  sounds: Normal heart sounds.  Pulmonary:     Effort: Pulmonary effort is normal.     Breath sounds: Normal breath sounds.  Abdominal:     General: Bowel sounds are normal.  Musculoskeletal:     Comments: 37.5cm bilateral calfs  Patient has negative Homans' sign  Negative Thompson's test  Neurological:     General: No focal deficit present.     Mental Status: She is alert.     Deep Tendon Reflexes:     Reflex Scores:      Patellar reflexes are 2+ on the right side and 2+ on the left side.    Comments: Bilateral lower extremity strength 5/5     No results found for any visits on 07/26/21.      Assessment & Plan:   Problem List Items Addressed This Visit       Other   Pain of left lower extremity - Primary    Administer Toradol 60 mg IM x1 dose in office.  Patient given precautions to avoid NSAIDs for the next 12 hours.  Patient also prescribed cyclobenzaprine 5 mg twice daily as needed.  Sedation precautions reviewed.  Patient does not  get better over the next couple days she will schedule an appoint with her sports medicine physician.  Do recommend patient take the next 2 days off of work to allow rest and healing.  Work excuse given at discharge      Relevant Medications   cyclobenzaprine (FLEXERIL) 5 MG tablet    Meds ordered this encounter  Medications   ketorolac (TORADOL) injection 60 mg   cyclobenzaprine (FLEXERIL) 5 MG tablet    Sig: Take 1 tablet (5 mg total) by mouth 2 (two) times daily as needed for muscle spasms.    Dispense:  20 tablet    Refill:  0    Order Specific Question:   Supervising Provider    Answer:   Loura Pardon A [[id number]]    Return if symptoms worsen or fail to improve, for If no improvement follow-up with Dr. Frederico Hamman Copland.  Romilda Garret, NP

## 2021-07-26 NOTE — Patient Instructions (Signed)
Nice to see you today We gave you Toradol in office today. Avoid NSAIDs for 12 hours this includes: ibuprofen, aleve, naproxen, Bc/Goody powders. Tylenol is ok to take The muscle relaxer can make you sleepy, use caution  Follow up with Dr. Lorelei Pont if you do not improve

## 2021-12-18 ENCOUNTER — Encounter: Payer: Self-pay | Admitting: Family Medicine

## 2022-01-10 ENCOUNTER — Encounter: Payer: Self-pay | Admitting: Family Medicine

## 2022-01-10 ENCOUNTER — Ambulatory Visit
Admission: RE | Admit: 2022-01-10 | Discharge: 2022-01-10 | Disposition: A | Discharge status: Home / Self Care | Payer: No Typology Code available for payment source | Source: Ambulatory Visit | Attending: Family Medicine | Admitting: Family Medicine

## 2022-01-10 ENCOUNTER — Ambulatory Visit (INDEPENDENT_AMBULATORY_CARE_PROVIDER_SITE_OTHER)
Admission: RE | Admit: 2022-01-10 | Discharge: 2022-01-10 | Disposition: A | Discharge status: Home / Self Care | Payer: No Typology Code available for payment source | Source: Ambulatory Visit | Attending: Family Medicine | Admitting: Family Medicine

## 2022-01-10 ENCOUNTER — Ambulatory Visit (INDEPENDENT_AMBULATORY_CARE_PROVIDER_SITE_OTHER): Payer: No Typology Code available for payment source | Admitting: Family Medicine

## 2022-01-10 VITALS — BP 130/68 | HR 85 | Temp 98.0°F | Ht 63.0 in | Wt 141.0 lb

## 2022-01-10 DIAGNOSIS — M79605 Pain in left leg: Secondary | ICD-10-CM | POA: Insufficient documentation

## 2022-01-10 MED ORDER — KETOROLAC TROMETHAMINE 30 MG/ML IJ SOLN
30.0000 mg | Freq: Once | INTRAMUSCULAR | Status: AC
  Administered 2022-01-10: 30 mg via INTRAMUSCULAR

## 2022-01-10 NOTE — Patient Instructions (Signed)
Toradol shot today for inflammation. Knee xrays today We will order left leg ultrasound to rule out blood clot.  We will be in touch with results.  In the meantime, ice or heat to knee, use knee brace for extra support.

## 2022-01-10 NOTE — Assessment & Plan Note (Signed)
Chronic L anterior knee discomfort since injury over summer, but with acute worsening of pain that started today to L anterior knee and and lateral/medial joint line without inciting trauma/injury. She also has calf pain and popliteal tenderness to palpation without obvious cord. Do recommend venous US to r/o DVT.  Given chronicity of left knee pain will check knee films today.  Rx toradol '30mg'$  IM today.  ?inflammatory suprapatellar bursitis  Will be in touch with imaging results.  Not consistent with gout or infected joint.

## 2022-01-10 NOTE — Progress Notes (Signed)
Patient ID: Kathy Pugh, female    DOB: [birth year], 62 y.o.   MRN: [id number]  This visit was conducted in person.  BP 130/68   Pulse 85   Temp 98 F (36.7 C) (Temporal)   Ht '5\' 3"'$  (1.6 m)   Wt 141 lb (64 kg)   SpO2 99%   BMI 24.98 kg/m    CC: L leg pain  Subjective:   HPI: Kathy Pugh is a 62 y.o. female presenting on 01/10/2022 for Knee Pain (C/o L knee pain. Denies injury. Pain described as burning in L thigh to knee. Started early this AM. Took Aleve this AM, helpful. )   This morning 1 am sudden onset L thigh and knee pain - described as burning pain.  Denies inciting falls injuries or trauma.  Back hurt last night treated with aspercream but today it's feeling better.   Took 2 aleve last night with mild benefit.  Also using heating pad to leg.   Seen 07/2021 with similar left leg pain to lower leg after playing basketball with grandsons - hasn't played since. Treated with toradol IM x1, flexeril, with benefit. However she notes trouble laying on left knee ever since.   No recent long car or plane rides. No personal  history of blood clots.  No shortness of breath or chest pain. Not on hormonal therapy.  No fevers/chills, streaking redness. Daughter had blood clot after foot surgery.   She had bunion surgery 11 yrs ago.      Relevant past medical, surgical, family and social history reviewed and updated as indicated. Interim medical history since our last visit reviewed. Allergies and medications reviewed and updated. Outpatient Medications Prior to Visit  Medication Sig Dispense Refill   cetirizine (ZYRTEC) 10 MG tablet Take 10 mg by mouth daily.     Cholecalciferol (VITAMIN D3) 50 MCG (2000 UT) TABS Take by mouth daily.     COD LIVER OIL PO Take 1 capsule by mouth daily.     Cyanocobalamin (VITAMIN B-12) 1000 MCG SUBL Place 1 tablet (1,000 mcg total) under the tongue daily. 1500 mcg  0   cyclobenzaprine (FLEXERIL) 5 MG tablet Take 1 tablet (5 mg total) by  mouth 2 (two) times daily as needed for muscle spasms. 20 tablet 0   folic acid (FOLVITE) 1 MG tablet Take 1 mg by mouth daily.     vitamin C (ASCORBIC ACID) 500 MG tablet Take 500 mg by mouth daily.     vitamin E 180 MG (400 UNITS) capsule Take 400 Units by mouth daily.     No facility-administered medications prior to visit.     Per HPI unless specifically indicated in ROS section below Review of Systems  Objective:  BP 130/68   Pulse 85   Temp 98 F (36.7 C) (Temporal)   Ht '5\' 3"'$  (1.6 m)   Wt 141 lb (64 kg)   SpO2 99%   BMI 24.98 kg/m   Wt Readings from Last 3 Encounters:  01/10/22 141 lb (64 kg)  07/26/21 140 lb (63.5 kg)  04/03/21 137 lb (62.1 kg)      Physical Exam Vitals and nursing note reviewed.  Constitutional:      Appearance: Normal appearance. She is not ill-appearing.     Comments: Uncomfortable, needs assistance to get on exam table  Musculoskeletal:        General: Swelling and tenderness present.     Right lower leg: No edema.  Left lower leg: No edema.     Comments:  2+ DP on right 1+ DP on left Calf circ bilaterally 36cm   R knee WNL L knee exam: No deformity on inspection. Marked discomfort with palpation of knee landmarks as well as calf and anterior thigh without palpable cords. Swelling noted to suprapatellar bursa. Limited ROM in flex/extension without crepitus due to pain. No popliteal fullness but tenderness present. Neg drawer test. Neg mcmurray test. Discomfort pain with valgus/varus stress. Discomfort with PF pressure. No abnormal patellar mobility.   Skin:    General: Skin is warm and dry.     Findings: No erythema or rash.  Neurological:     Mental Status: She is alert.  Psychiatric:        Mood and Affect: Mood normal.        Behavior: Behavior normal.       Results for orders placed or performed in visit on 02/14/21  Fecal occult blood, imunochemical   Specimen: Stool  Result Value Ref Range   Fecal Occult Bld  Positive (A) Negative    Assessment & Plan:   Left leg pain Assessment & Plan: Chronic L anterior knee discomfort since injury over summer, but with acute worsening of pain that started today to L anterior knee and and lateral/medial joint line without inciting trauma/injury. She also has calf pain and popliteal tenderness to palpation without obvious cord. Do recommend venous US to r/o DVT.  Given chronicity of left knee pain will check knee films today.  Rx toradol '30mg'$  IM today.  ?inflammatory suprapatellar bursitis  Will be in touch with imaging results.  Not consistent with gout or infected joint.   Orders: -     DG Knee Complete 4 Views Left; Future -     US Venous Img Lower Unilateral Left (DVT); Future -     Ketorolac Tromethamine -     US Venous Img Lower Unilateral Left (DVT); Future     Patient Instructions  Toradol shot today for inflammation. Knee xrays today We will order left leg ultrasound to rule out blood clot.  We will be in touch with results.  In the meantime, ice or heat to knee, use knee brace for extra support.    Follow up plan: No follow-ups on file.  Ria Bush, MD

## 2022-01-12 ENCOUNTER — Other Ambulatory Visit: Payer: Self-pay | Admitting: Family Medicine

## 2022-01-12 MED ORDER — NAPROXEN 500 MG PO TABS: ORAL_TABLET | ORAL | 0 refills | Status: DC

## 2022-01-14 NOTE — Telephone Encounter (Signed)
Late entry - spoke with patient on Friday 1/12

## 2022-01-21 NOTE — Progress Notes (Signed)
Kathy Pugh T. Kathy Agro, MD, [place] at [facility] [place], [address]  Phone: [phone]  FAX: [place] - 62 y.o. female  MRN [id number]  Date of Birth: [birth year]  Date: 01/22/2022  PCP: Ria Bush, MD  Referral: Ria Bush, MD  Chief Complaint  Patient presents with   Knee Pain    Left-Started hurting on 01/10/22   Subjective:   Kathy Pugh is a 62 y.o. very pleasant female patient with Body mass index is 25.33 kg/m. who presents with the following:  The patient presents with left-sided knee pain.  She actually was having a workup for a DVT with an ultrasound of the left leg, and even though she was already having left knee pain she fell when she was in the parking lot at the hospital.  This was on January 10, 2021.  Since then, She has actually fallen 4 different times.  She has fallen had direct trauma to the region, and she has also had her knee give out several times.  She does not have any bruising or focal swelling.  L leg lower is numb.  Left lower lateral lower extremity.  Knee will throb like a tooth ache.  Cannot get comfortable at all.  She has a deep dull ache in the joint.  She feels as if she is having some symptomatic giving way at various times when standing, she is worried about falling  When she fell, was walking to her car, weight was on her l foot.   4 falls - in two weeks.   Pain all in the L knee.  No pain at times, but other times it will hurt quite a bit.  Will have some pain at night - like a toothache.   She has not had an effusion.  She has not had any locking up of the joint.  Review of Systems is noted in the HPI, as appropriate  Objective:   BP 112/60   Pulse 79   Temp 98.4 F (36.9 C) (Oral)   Ht '5\' 3"'$  (1.6 m)   Wt 143 lb (64.9 kg)   SpO2 97%   BMI 25.33 kg/m   GEN: No acute distress; alert,appropriate. PULM: Breathing comfortably  in no respiratory distress PSYCH: Normally interactive.   Left knee: Full extension.  Flexion to 120. She does have some pain with loading the medial lateral patellar facets. Stable to varus and valgus stress, however with varus stress there is some pain directly at the lateral collateral ligament, and the joint does not open up at all. Stable Lachman and drawer testing. She does have some mild pain with terminal flexion pinch and McMurray's, but no mechanical symptoms.  Laboratory and Imaging Data: No results found.   Assessment and Plan:     ICD-10-CM   1. Acute pain of left knee  M25.562 DG Knee Complete 4 Views Left    2. Pain of left lower extremity  M79.605      Ongoing pain and instability after multiple mechanical falls.  Patient's maximal pain is at the lateral joint line and directly on the lateral collateral ligament with stress.  She seems to have had an mild lateral collateral ligament sprain.  She certainly could have additional knee contusion, as well.  I placed the patient in a patellar J brace and I am going to have her wear this for the next 2 to 3 weeks.  She  is also going to do scheduled anti-inflammatories during that time.  Medication Management during today's office visit: Meds ordered this encounter  Medications   naproxen (NAPROSYN) 500 MG tablet    Sig: Take one po bid x 5 days then prn pain, take with food    Dispense:  60 tablet    Refill:  0   Medications Discontinued During This Encounter  Medication Reason   naproxen (NAPROSYN) 500 MG tablet Reorder    Orders placed today for conditions managed today: Orders Placed This Encounter  Procedures   DG Knee Complete 4 Views Left    Disposition: Return in about 3 weeks (around 02/12/2022).  [employer] speech-to-text software was used for transcription in this dictation.  Possible transcriptional errors can occur using Editor, commissioning.   Signed,  Maud Deed. Vernadette Stutsman, MD   Outpatient  Encounter Medications as of 01/22/2022  Medication Sig   cetirizine (ZYRTEC) 10 MG tablet Take 10 mg by mouth daily.   Cholecalciferol (VITAMIN D3) 50 MCG (2000 UT) TABS Take by mouth daily.   COD LIVER OIL PO Take 1 capsule by mouth daily.   Cyanocobalamin (VITAMIN B-12) 1000 MCG SUBL Place 1 tablet (1,000 mcg total) under the tongue daily. 1500 mcg   cyclobenzaprine (FLEXERIL) 5 MG tablet Take 1 tablet (5 mg total) by mouth 2 (two) times daily as needed for muscle spasms.   folic acid (FOLVITE) 1 MG tablet Take 1 mg by mouth daily.   vitamin C (ASCORBIC ACID) 500 MG tablet Take 500 mg by mouth daily.   vitamin E 180 MG (400 UNITS) capsule Take 400 Units by mouth daily.   [DISCONTINUED] naproxen (NAPROSYN) 500 MG tablet Take one po bid x 5 days then prn pain, take with food   naproxen (NAPROSYN) 500 MG tablet Take one po bid x 5 days then prn pain, take with food   No facility-administered encounter medications on file as of 01/22/2022.

## 2022-01-22 ENCOUNTER — Ambulatory Visit (INDEPENDENT_AMBULATORY_CARE_PROVIDER_SITE_OTHER): Payer: No Typology Code available for payment source | Admitting: Family Medicine

## 2022-01-22 ENCOUNTER — Encounter: Payer: Self-pay | Admitting: Family Medicine

## 2022-01-22 ENCOUNTER — Ambulatory Visit (INDEPENDENT_AMBULATORY_CARE_PROVIDER_SITE_OTHER)
Admission: RE | Admit: 2022-01-22 | Discharge: 2022-01-22 | Disposition: A | Discharge status: Home / Self Care | Payer: No Typology Code available for payment source | Source: Ambulatory Visit | Attending: Family Medicine | Admitting: Family Medicine

## 2022-01-22 VITALS — BP 112/60 | HR 79 | Temp 98.4°F | Ht 63.0 in | Wt 143.0 lb

## 2022-01-22 DIAGNOSIS — M79605 Pain in left leg: Secondary | ICD-10-CM | POA: Diagnosis not present

## 2022-01-22 DIAGNOSIS — M25562 Pain in left knee: Secondary | ICD-10-CM

## 2022-01-22 MED ORDER — NAPROXEN 500 MG PO TABS: ORAL_TABLET | ORAL | 0 refills | Status: DC

## 2022-01-23 ENCOUNTER — Encounter: Payer: Self-pay | Admitting: Family Medicine

## 2022-02-04 ENCOUNTER — Other Ambulatory Visit: Payer: Self-pay | Admitting: Family Medicine

## 2022-02-04 ENCOUNTER — Encounter: Payer: Self-pay | Admitting: Family Medicine

## 2022-02-04 DIAGNOSIS — E559 Vitamin D deficiency, unspecified: Secondary | ICD-10-CM

## 2022-02-04 DIAGNOSIS — E785 Hyperlipidemia, unspecified: Secondary | ICD-10-CM

## 2022-02-09 ENCOUNTER — Other Ambulatory Visit (INDEPENDENT_AMBULATORY_CARE_PROVIDER_SITE_OTHER): Payer: No Typology Code available for payment source

## 2022-02-09 DIAGNOSIS — E559 Vitamin D deficiency, unspecified: Secondary | ICD-10-CM

## 2022-02-09 DIAGNOSIS — E785 Hyperlipidemia, unspecified: Secondary | ICD-10-CM

## 2022-02-09 LAB — LIPID PANEL
Cholesterol: 155 mg/dL (ref 0–200)
HDL: 53.2 mg/dL (ref >39.00)
LDL Cholesterol: 92 mg/dL (ref 0–99)
NonHDL: 101.88
Total CHOL/HDL Ratio: 3
Triglycerides: 48 mg/dL (ref 0.0–149.0)
VLDL: 9.6 mg/dL (ref 0.0–40.0)

## 2022-02-09 LAB — COMPREHENSIVE METABOLIC PANEL
ALT: 14 U/L (ref 0–35)
AST: 19 U/L (ref 0–37)
Albumin: 4.3 g/dL (ref 3.5–5.2)
Alkaline Phosphatase: 65 U/L (ref 39–117)
BUN: 16 mg/dL (ref 6–23)
CO2: 26 mEq/L (ref 19–32)
Calcium: 9.4 mg/dL (ref 8.4–10.5)
Chloride: 104 mEq/L (ref 96–112)
Creatinine, Ser: 0.77 mg/dL (ref 0.40–1.20)
GFR: 81.9 mL/min (ref >60.00)
Glucose, Bld: 87 mg/dL (ref 70–99)
Potassium: 4.8 mEq/L (ref 3.5–5.1)
Sodium: 146 mEq/L — ABNORMAL HIGH (ref 135–145)
Total Bilirubin: 0.4 mg/dL (ref 0.2–1.2)
Total Protein: 6.5 g/dL (ref 6.0–8.3)

## 2022-02-09 LAB — VITAMIN D 25 HYDROXY (VIT D DEFICIENCY, FRACTURES): VITD: 38.73 ng/mL (ref 30.00–100.00)

## 2022-02-12 ENCOUNTER — Encounter: Payer: Self-pay | Admitting: Family Medicine

## 2022-02-12 ENCOUNTER — Ambulatory Visit (INDEPENDENT_AMBULATORY_CARE_PROVIDER_SITE_OTHER): Payer: No Typology Code available for payment source | Admitting: Family Medicine

## 2022-02-12 VITALS — BP 114/62 | HR 77 | Temp 97.9°F | Ht 63.0 in | Wt 143.4 lb

## 2022-02-12 DIAGNOSIS — M25562 Pain in left knee: Secondary | ICD-10-CM

## 2022-02-12 NOTE — Progress Notes (Unsigned)
    Branden Vine T. Jesua Tamblyn, MD, [place] at [facility] [place], [address]  Phone: [phone]  FAX: [place] - 62 y.o. female  MRN [id number]  Date of Birth: 06/19/1960  Date: 02/12/2022  PCP: Ria Bush, MD  Referral: Ria Bush, MD  Chief Complaint  Patient presents with   Knee Pain    Left-3 week follow up   Subjective:   Kathy Pugh is a 62 y.o. very pleasant female patient with Body mass index is 25.4 kg/m. who presents with the following:  She is here for 3-week follow-up.  The last time I saw her, she had had 4 different mechanical falls and had hurt her left knee.  Also seemed at the time that she had sprained her LCL, and I placed her in a patellar J brace with close follow-up today.  Also given her some oral NSAIDs.  Has been wearing her patellar-j brace.  Has started walking again.   Review of Systems is noted in the HPI, as appropriate  Objective:   BP 114/62   Pulse 77   Temp 97.9 F (36.6 C) (Temporal)   Ht '5\' 3"'$  (1.6 m)   Wt 143 lb 6 oz (65 kg)   SpO2 96%   BMI 25.40 kg/m   GEN: No acute distress; alert,appropriate. PULM: Breathing comfortably in no respiratory distress PSYCH: Normally interactive.   Laboratory and Imaging Data:  Assessment and Plan:   ***

## 2022-02-13 ENCOUNTER — Encounter: Payer: Self-pay | Admitting: Family Medicine

## 2022-02-16 ENCOUNTER — Encounter: Payer: Self-pay | Admitting: Family Medicine

## 2022-02-16 ENCOUNTER — Ambulatory Visit (INDEPENDENT_AMBULATORY_CARE_PROVIDER_SITE_OTHER): Payer: No Typology Code available for payment source | Admitting: Family Medicine

## 2022-02-16 ENCOUNTER — Other Ambulatory Visit (HOSPITAL_COMMUNITY)
Admission: RE | Admit: 2022-02-16 | Discharge: 2022-02-16 | Disposition: A | Discharge status: Home / Self Care | Payer: No Typology Code available for payment source | Source: Ambulatory Visit | Attending: Family Medicine | Admitting: Family Medicine

## 2022-02-16 VITALS — BP 120/62 | HR 71 | Temp 97.7°F | Ht 62.5 in | Wt 139.0 lb

## 2022-02-16 DIAGNOSIS — E559 Vitamin D deficiency, unspecified: Secondary | ICD-10-CM

## 2022-02-16 DIAGNOSIS — Z01419 Encounter for gynecological examination (general) (routine) without abnormal findings: Secondary | ICD-10-CM

## 2022-02-16 DIAGNOSIS — Z780 Asymptomatic menopausal state: Secondary | ICD-10-CM

## 2022-02-16 DIAGNOSIS — Z0000 Encounter for general adult medical examination without abnormal findings: Secondary | ICD-10-CM | POA: Diagnosis not present

## 2022-02-16 DIAGNOSIS — Z7189 Other specified counseling: Secondary | ICD-10-CM

## 2022-02-16 DIAGNOSIS — E785 Hyperlipidemia, unspecified: Secondary | ICD-10-CM

## 2022-02-16 MED ORDER — VITAMIN D3 50 MCG (2000 UT) PO TABS: 4000.0000 [IU] | ORAL_TABLET | Freq: Every day | ORAL | Status: AC

## 2022-02-16 NOTE — Assessment & Plan Note (Signed)
Advanced directive discussion - has this at home. Husband Pilar Plate then daughter Melvenia Needles would be HCPOA. Asked to bring [place] a copy.

## 2022-02-16 NOTE — Assessment & Plan Note (Signed)
Preventative protocols reviewed and updated unless pt declined. Discussed healthy diet and lifestyle.  

## 2022-02-16 NOTE — Patient Instructions (Addendum)
Pap smear today.  Bring [place] a copy of your advanced directive to update your chart.  Good to see you today Return as needed or in 1 year for next physical

## 2022-02-16 NOTE — Assessment & Plan Note (Signed)
She has increased vit D to 4000 IU daily.

## 2022-02-16 NOTE — Progress Notes (Signed)
Patient ID: Kathy Pugh, female    DOB: 09-27-1960, 62 y.o.   MRN: [id number]  This visit was conducted in person.  BP 120/62   Pulse 71   Temp 97.7 F (36.5 C) (Temporal)   Ht 5' 2.5" (1.588 m)   Wt 139 lb (63 kg)   SpO2 95%   BMI 25.02 kg/m    CC: CPE Subjective:   HPI: Kathy Pugh is a 62 y.o. female presenting on 02/16/2022 for Annual Exam   Planning to retire May 2024.  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  Knee continues improving - regularly using brace.   Preventative: COLONOSCOPY Date: 01/2004 normal [name]).  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 Does SBE at home without concerns. Will do CBE QOY with pelvic.  Well woman exam - paps always normal, last 02/2020.  Mom with ?h/o uterine cancer, abnormal cells that led to hysterectomy. Given fm hx requests q2 yrs.  Menopause ~2003 at age 59. Off angeliq since 2018. Was on this for hot flashes and vag dryness.  Bone density - discussed cal, vit D in diet, and weight bearing exercise. Good calcium intake. walking somewhat limited by recent knee issues - overall improving.  Lung cancer screen - not eligible  Flu shot at work.  [place] 03/2019, 04/2019, booster 10/2019, bivalent 02/2021, 10/2021 Tdap 11/2012  RSV - discussed  Shingrix - 11/2018, 05/2019 Advanced directive discussion - has this at home. Husband Pilar Plate then daughter Melvenia Needles would be HCPOA. Asked to bring [place] a copy.  Seat belt use discussed  Sunscreen use discussed. No suspicious moles on skin.  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 Yogurt for breakfast, 1 cup milk/day       Relevant past medical, surgical, family and social history reviewed and updated as indicated. Interim medical history since our last visit reviewed. Allergies and medications reviewed and updated. Outpatient Medications Prior to Visit  Medication Sig Dispense Refill   cetirizine (ZYRTEC) 10 MG tablet Take 10 mg by mouth daily.     COD LIVER OIL PO Take 1 capsule by mouth daily.     Cyanocobalamin (VITAMIN B-12) 1000 MCG SUBL Place 1 tablet (1,000 mcg total) under the tongue daily. 1500 mcg  0   cyclobenzaprine (FLEXERIL) 5 MG tablet Take 1 tablet (5 mg total) by mouth 2 (two) times daily as needed for muscle spasms. 20 tablet 0   folic acid (FOLVITE) 1 MG tablet Take 1 mg by mouth daily.     naproxen (NAPROSYN) 500 MG tablet Take one po bid x 5 days then prn pain, take with food 60 tablet 0   vitamin C (ASCORBIC ACID) 500 MG tablet Take 500 mg by mouth daily.     vitamin E 180 MG (400 UNITS) capsule Take 400 Units by mouth daily.     Cholecalciferol (VITAMIN D3) 50 MCG (2000 UT) TABS Take by mouth daily.     No facility-administered medications prior to visit.  Per HPI unless specifically indicated in ROS section below Review of Systems  Constitutional:  Negative for activity change, appetite change, chills, fatigue, fever and unexpected weight change.  HENT:  Negative for hearing loss.   Eyes:  Negative for visual disturbance.  Respiratory:  Negative for cough, chest tightness, shortness of breath and wheezing.   Cardiovascular:  Negative for chest pain, palpitations and leg swelling.  Gastrointestinal:  Negative for abdominal distention, abdominal pain, blood in stool, constipation, diarrhea, nausea and vomiting.  Genitourinary:  Negative for difficulty urinating and hematuria.  Musculoskeletal:  Negative for arthralgias, myalgias and neck pain.  Skin:  Negative for rash.   Neurological:  Negative for dizziness, seizures, syncope and headaches.  Hematological:  Negative for adenopathy. Does not bruise/bleed easily.  Psychiatric/Behavioral:  Negative for dysphoric mood. The patient is not nervous/anxious.     Objective:  BP 120/62   Pulse 71   Temp 97.7 F (36.5 C) (Temporal)   Ht 5' 2.5" (1.588 m)   Wt 139 lb (63 kg)   SpO2 95%   BMI 25.02 kg/m   Wt Readings from Last 3 Encounters:  02/16/22 139 lb (63 kg)  02/12/22 143 lb 6 oz (65 kg)  01/22/22 143 lb (64.9 kg)      Physical Exam Vitals and nursing note reviewed. Exam conducted with a chaperone present.  Constitutional:      Appearance: Normal appearance. She is not ill-appearing.  HENT:     Head: Normocephalic and atraumatic.     Right Ear: Tympanic membrane, ear canal and external ear normal. There is no impacted cerumen.     Left Ear: Tympanic membrane, ear canal and external ear normal. There is no impacted cerumen.     Mouth/Throat:     Comments: Wearing mask Eyes:     General:        Right eye: No discharge.        Left eye: No discharge.     Extraocular Movements: Extraocular movements intact.     Conjunctiva/sclera: Conjunctivae normal.     Pupils: Pupils are equal, round, and reactive to light.  Neck:     Thyroid: No thyroid mass or thyromegaly.     Vascular: No carotid bruit.  Cardiovascular:     Rate and Rhythm: Normal rate and regular rhythm.     Pulses: Normal pulses.     Heart sounds: Normal heart sounds. No murmur heard. Pulmonary:     Effort: Pulmonary effort is normal. No respiratory distress.     Breath sounds: Normal breath sounds. No wheezing, rhonchi or rales.  Abdominal:     General: Bowel sounds are normal. There is no distension.     Palpations: Abdomen is soft. There is no mass.     Tenderness: There is no abdominal tenderness. There is no guarding or rebound.     Hernia: No hernia is present.  Genitourinary:    Exam position: Supine.     Labia:         Right: No rash, tenderness or lesion.        Left: No rash, tenderness or lesion.      Vagina: Normal.     Cervix: Normal.     Uterus: Normal.      Adnexa: Right adnexa normal and left adnexa normal.     Comments: Pap performed on cervix Musculoskeletal:     Cervical back: Normal range of motion and neck supple. No rigidity.     Right lower leg: No  edema.     Left lower leg: No edema.  Lymphadenopathy:     Cervical: No cervical adenopathy.  Skin:    General: Skin is warm and dry.     Findings: No rash.  Neurological:     General: No focal deficit present.     Mental Status: She is alert. Mental status is at baseline.  Psychiatric:        Mood and Affect: Mood normal.        Behavior: Behavior normal.       Results for orders placed or performed in visit on 02/09/22  VITAMIN D 25 Hydroxy (Vit-D Deficiency, Fractures)  Result Value Ref Range   VITD 38.73 30.00 - 100.00 ng/mL  Comprehensive metabolic panel  Result Value Ref Range   Sodium 146 (H) 135 - 145 mEq/L   Potassium 4.8 3.5 - 5.1 mEq/L   Chloride 104 96 - 112 mEq/L   CO2 26 19 - 32 mEq/L   Glucose, Bld 87 70 - 99 mg/dL   BUN 16 6 - 23 mg/dL   Creatinine, Ser 0.77 0.40 - 1.20 mg/dL   Total Bilirubin 0.4 0.2 - 1.2 mg/dL   Alkaline Phosphatase 65 39 - 117 U/L   AST 19 0 - 37 U/L   ALT 14 0 - 35 U/L   Total Protein 6.5 6.0 - 8.3 g/dL   Albumin 4.3 3.5 - 5.2 g/dL   GFR 81.90 >60.00 mL/min   Calcium 9.4 8.4 - 10.5 mg/dL  Lipid panel  Result Value Ref Range   Cholesterol 155 0 - 200 mg/dL   Triglycerides 48.0 0.0 - 149.0 mg/dL   HDL 53.20 >39.00 mg/dL   VLDL 9.6 0.0 - 40.0 mg/dL   LDL Cholesterol 92 0 - 99 mg/dL   Total CHOL/HDL Ratio 3    NonHDL 101.88     Assessment & Plan:   Problem List Items Addressed This Visit     Healthcare maintenance - Primary (Chronic)    Preventative protocols reviewed and updated unless pt declined. Discussed healthy diet and lifestyle.       Advanced directives,  counseling/discussion (Chronic)    Advanced directive discussion - has this at home. Husband Pilar Plate then daughter Melvenia Needles would be HCPOA. Asked to bring [place] a copy.       Vitamin D deficiency    She has increased vit D to 4000 IU daily.       Hyperlipidemia    Great control off medication. The 10-year ASCVD risk score (Arnett DK, et al., 2019) is: 3.5%   Values used to calculate the score:     Age: 33 years     Sex: Female     Is Non-Hispanic African American: No     Diabetic: No     Tobacco smoker: No     Systolic Blood Pressure: 123456 mmHg     Is BP treated: No     HDL Cholesterol: 53.2 mg/dL     Total Cholesterol: 155 mg/dL       Postmenopausal   Other Visit Diagnoses     Encounter for annual routine gynecological examination       Relevant Orders   Cytology - PAP        Meds ordered this encounter  Medications   Cholecalciferol (VITAMIN D3) 50 MCG (2000 UT) TABS    Sig: Take 2 tablets (4,000 Units total) by mouth daily.    No orders of the defined types were placed in this encounter.  Patient Instructions  Pap smear today.  Bring [place] a copy of your advanced directive to update your chart.  Good to see you today Return as needed or in 1 year for next physical   Follow up plan: Return in about 1 year (around 02/17/2023) for annual exam, prior fasting for blood work.  Ria Bush, MD

## 2022-02-16 NOTE — Assessment & Plan Note (Signed)
Great control off medication. The 10-year ASCVD risk score (Arnett DK, et al., 2019) is: 3.5%   Values used to calculate the score:     Age: 62 years     Sex: Female     Is Non-Hispanic African American: No     Diabetic: No     Tobacco smoker: No     Systolic Blood Pressure: 123456 mmHg     Is BP treated: No     HDL Cholesterol: 53.2 mg/dL     Total Cholesterol: 155 mg/dL

## 2022-02-21 ENCOUNTER — Other Ambulatory Visit: Payer: Self-pay | Admitting: Family Medicine

## 2022-02-21 LAB — CYTOLOGY - PAP
Chlamydia: NEGATIVE
Comment: NEGATIVE
Comment: NEGATIVE
Comment: NEGATIVE
Comment: NORMAL
Diagnosis: NEGATIVE
High risk HPV: NEGATIVE
Neisseria Gonorrhea: NEGATIVE
Trichomonas: NEGATIVE

## 2022-02-21 NOTE — Telephone Encounter (Signed)
Last office visit 02/16/22 with Dr. Darnell Level for CPE.  Last refilled 01/22/22 for #60 with no refills by Dr. Lorelei Pont with instructions to take one po bid x 5 days, then prn pain.  Next Appt: 02/18/23 for CPE with Dr. Darnell Level. (Please update instructions on Rx)

## 2022-02-23 IMAGING — MG MM DIGITAL SCREENING BILAT W/ TOMO AND CAD
6 of 10 series · 6 of 30 positions shown · non-contrast
Comparison: Previous exam(s).

CLINICAL DATA: Screening.

EXAM:
DIGITAL SCREENING BILATERAL MAMMOGRAM WITH TOMOSYNTHESIS AND CAD
TECHNIQUE: Bilateral screening digital craniocaudal and mediolateral oblique
mammograms were obtained. Bilateral screening digital breast
tomosynthesis was performed. The images were evaluated with
computer-aided detection.

[L MLO synth-2D]
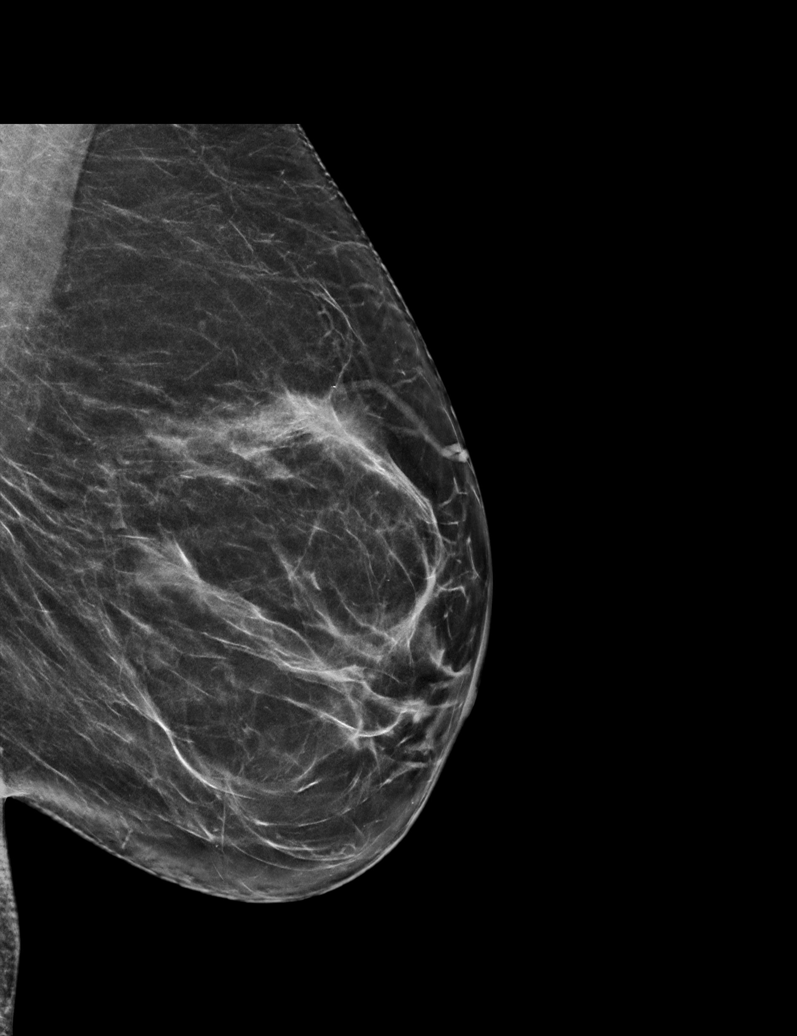

[R MLO synth-2D]
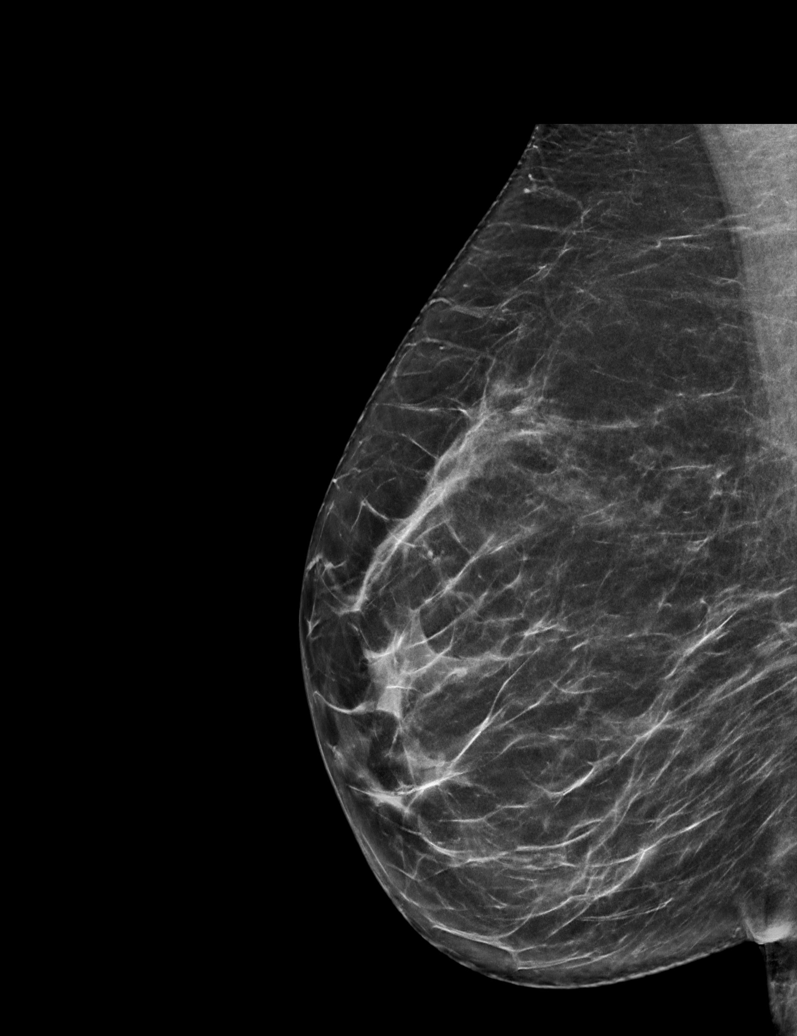

[R CC synth-2D (1 of 2)]
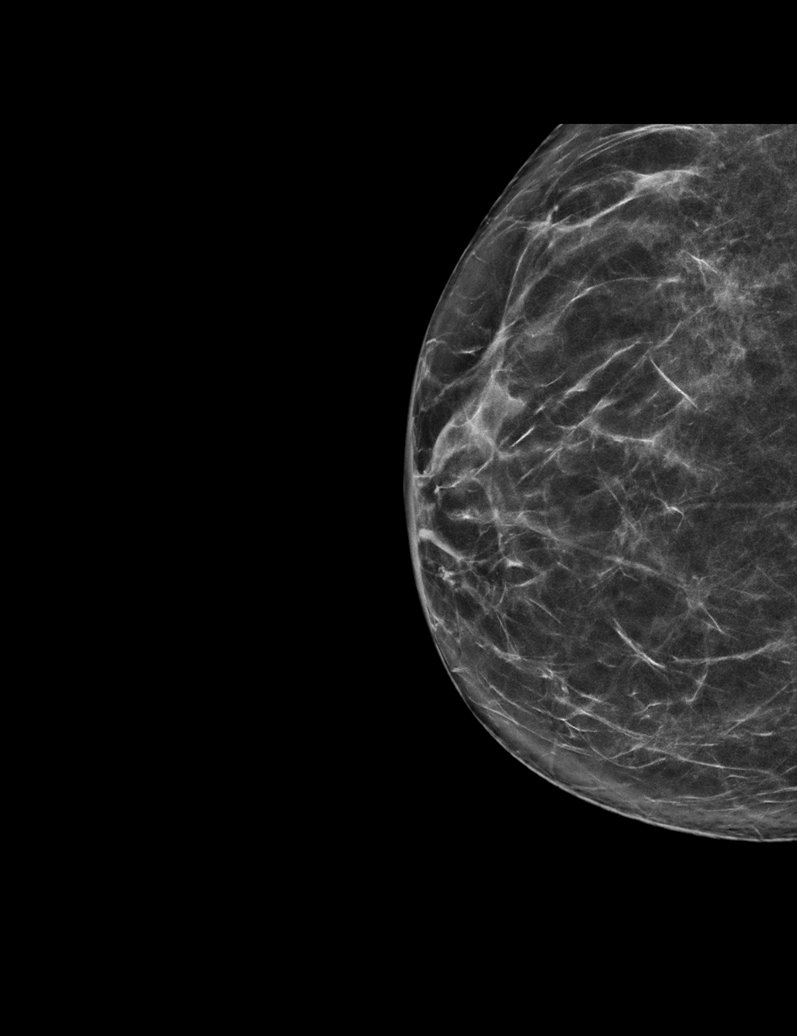

[L CC synth-2D]
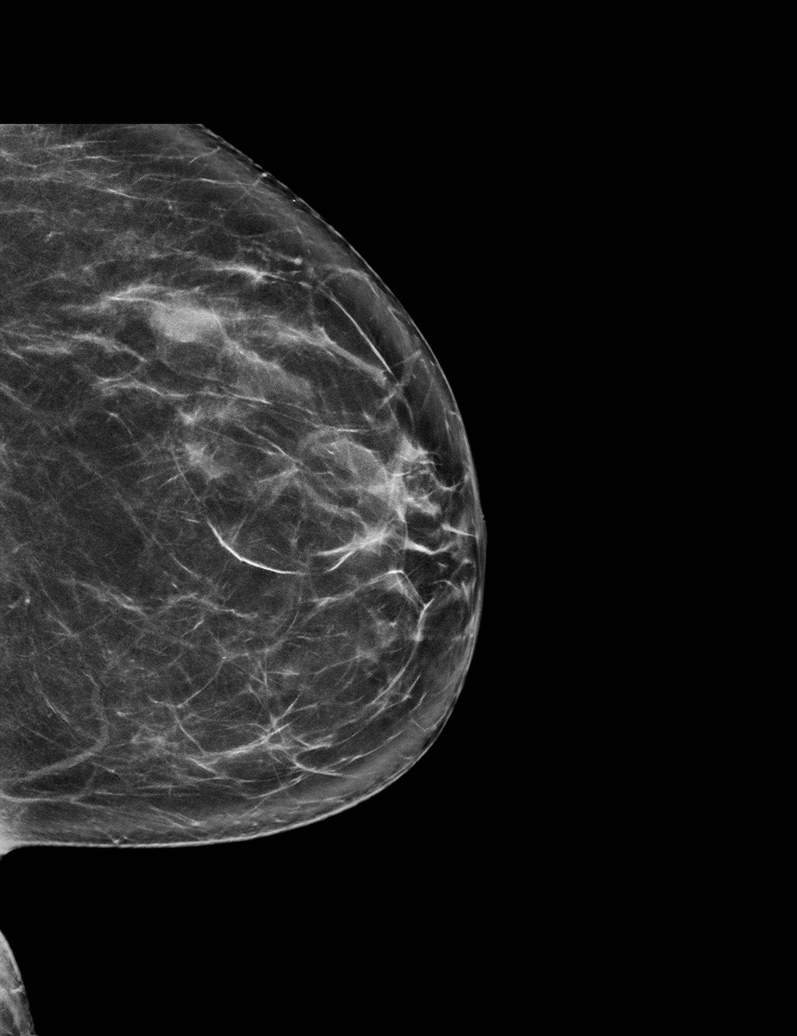

[R CC synth-2D (2 of 2)]
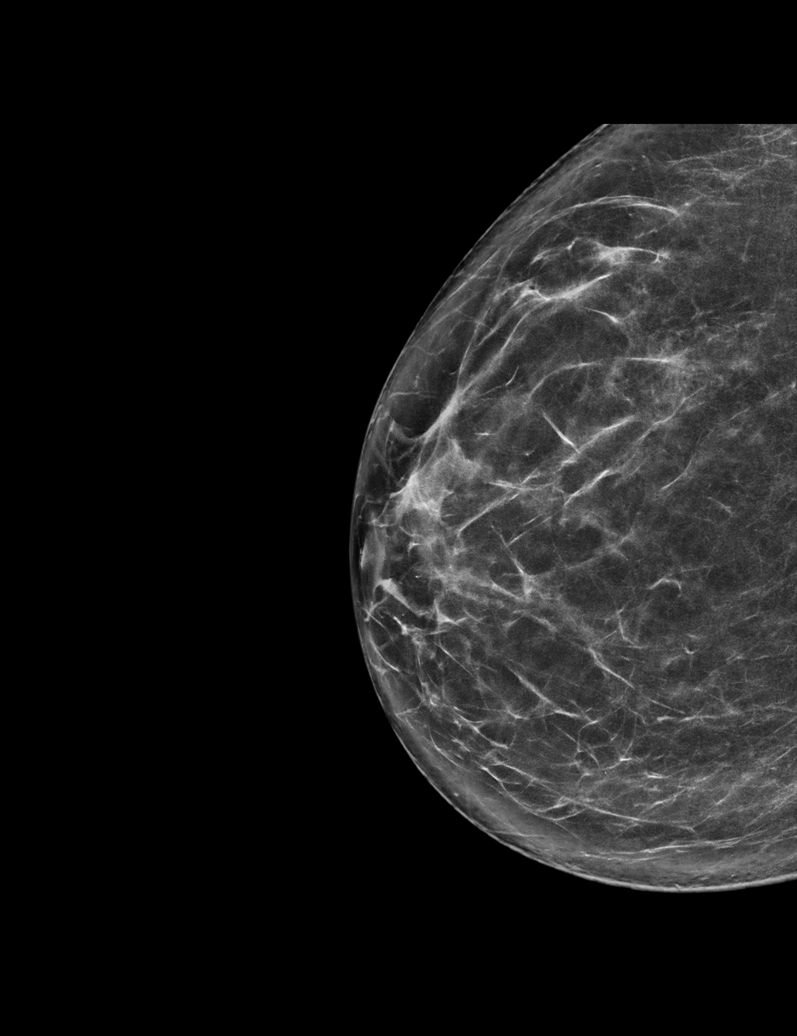

[R MLO tomo · tomo slice 35/69.0]
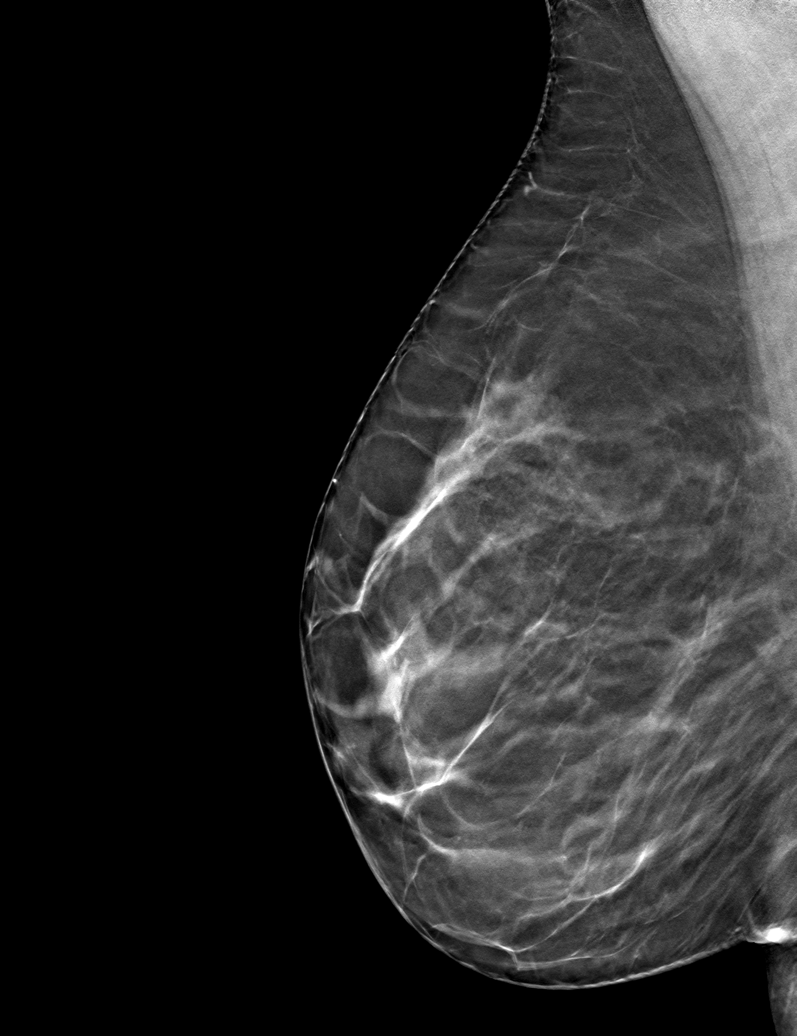

[6 of 30 positions shown; findings below may reference images not displayed]

ACR Breast Density Category b: There are scattered areas of
fibroglandular density.
FINDINGS: There are no findings suspicious for malignancy.
IMPRESSION: No mammographic evidence of malignancy. A result letter of this
screening mammogram will be mailed directly to the patient.

RECOMMENDATION:
Screening mammogram in one year. (Code:51-O-LD2)

BI-RADS CATEGORY  1: Negative.

## 2022-06-07 ENCOUNTER — Other Ambulatory Visit: Payer: Self-pay | Admitting: Family Medicine

## 2022-06-07 DIAGNOSIS — Z1231 Encounter for screening mammogram for malignant neoplasm of breast: Secondary | ICD-10-CM

## 2022-06-26 ENCOUNTER — Other Ambulatory Visit: Payer: Self-pay | Admitting: Family Medicine

## 2022-06-26 DIAGNOSIS — Z1231 Encounter for screening mammogram for malignant neoplasm of breast: Secondary | ICD-10-CM

## 2022-07-04 ENCOUNTER — Ambulatory Visit
Admission: RE | Admit: 2022-07-04 | Discharge: 2022-07-04 | Disposition: A | Discharge status: Home / Self Care | Payer: 59 | Source: Ambulatory Visit | Attending: Family Medicine | Admitting: Family Medicine

## 2022-07-04 DIAGNOSIS — Z1231 Encounter for screening mammogram for malignant neoplasm of breast: Secondary | ICD-10-CM | POA: Insufficient documentation

## 2023-02-07 ENCOUNTER — Other Ambulatory Visit: Payer: Self-pay | Admitting: Family Medicine

## 2023-02-07 DIAGNOSIS — E785 Hyperlipidemia, unspecified: Secondary | ICD-10-CM

## 2023-02-07 DIAGNOSIS — Z780 Asymptomatic menopausal state: Secondary | ICD-10-CM

## 2023-02-07 DIAGNOSIS — E559 Vitamin D deficiency, unspecified: Secondary | ICD-10-CM

## 2023-02-11 ENCOUNTER — Other Ambulatory Visit (INDEPENDENT_AMBULATORY_CARE_PROVIDER_SITE_OTHER): Payer: 59

## 2023-02-11 DIAGNOSIS — E785 Hyperlipidemia, unspecified: Secondary | ICD-10-CM

## 2023-02-11 DIAGNOSIS — E559 Vitamin D deficiency, unspecified: Secondary | ICD-10-CM | POA: Diagnosis not present

## 2023-02-11 LAB — COMPREHENSIVE METABOLIC PANEL
ALT: 17 U/L (ref 0–35)
AST: 20 U/L (ref 0–37)
Albumin: 4.5 g/dL (ref 3.5–5.2)
Alkaline Phosphatase: 71 U/L (ref 39–117)
BUN: 13 mg/dL (ref 6–23)
CO2: 27 meq/L (ref 19–32)
Calcium: 9.4 mg/dL (ref 8.4–10.5)
Chloride: 106 meq/L (ref 96–112)
Creatinine, Ser: 0.8 mg/dL (ref 0.40–1.20)
GFR: 77.68 mL/min (ref >60.00)
Glucose, Bld: 88 mg/dL (ref 70–99)
Potassium: 4.5 meq/L (ref 3.5–5.1)
Sodium: 142 meq/L (ref 135–145)
Total Bilirubin: 0.5 mg/dL (ref 0.2–1.2)
Total Protein: 6.8 g/dL (ref 6.0–8.3)

## 2023-02-11 LAB — LIPID PANEL
Cholesterol: 174 mg/dL (ref 0–200)
HDL: 59.9 mg/dL (ref >39.00)
LDL Cholesterol: 95 mg/dL (ref 0–99)
NonHDL: 114.25
Total CHOL/HDL Ratio: 3
Triglycerides: 94 mg/dL (ref 0.0–149.0)
VLDL: 18.8 mg/dL (ref 0.0–40.0)

## 2023-02-11 LAB — VITAMIN D 25 HYDROXY (VIT D DEFICIENCY, FRACTURES): VITD: 35.03 ng/mL (ref 30.00–100.00)

## 2023-02-11 LAB — TSH: TSH: 1.64 u[IU]/mL (ref 0.35–5.50)

## 2023-02-18 ENCOUNTER — Encounter: Payer: No Typology Code available for payment source | Admitting: Family Medicine

## 2023-02-26 ENCOUNTER — Ambulatory Visit (INDEPENDENT_AMBULATORY_CARE_PROVIDER_SITE_OTHER): Payer: 59 | Admitting: Family Medicine

## 2023-02-26 ENCOUNTER — Encounter: Payer: Self-pay | Admitting: Family Medicine

## 2023-02-26 VITALS — BP 120/66 | HR 78 | Temp 98.1°F | Ht 62.75 in | Wt 142.0 lb

## 2023-02-26 DIAGNOSIS — E559 Vitamin D deficiency, unspecified: Secondary | ICD-10-CM

## 2023-02-26 DIAGNOSIS — Z0000 Encounter for general adult medical examination without abnormal findings: Secondary | ICD-10-CM | POA: Diagnosis not present

## 2023-02-26 DIAGNOSIS — E2839 Other primary ovarian failure: Secondary | ICD-10-CM

## 2023-02-26 DIAGNOSIS — Z23 Encounter for immunization: Secondary | ICD-10-CM

## 2023-02-26 DIAGNOSIS — E785 Hyperlipidemia, unspecified: Secondary | ICD-10-CM

## 2023-02-26 DIAGNOSIS — Z7189 Other specified counseling: Secondary | ICD-10-CM | POA: Diagnosis not present

## 2023-02-26 NOTE — Assessment & Plan Note (Addendum)
 Preventative protocols reviewed and updated unless pt declined. Discussed healthy diet and lifestyle.  Will order DEXA to be done at upcoming mammogram summer 2025

## 2023-02-26 NOTE — Assessment & Plan Note (Signed)
 Previously discussed, asked to bring [place] copy

## 2023-02-26 NOTE — Patient Instructions (Addendum)
 Tdap today  When you schedule your mammogram for this summer, ask to also schedule bone density scan.  Bring [place] copy of your advanced directive.  Good to see you today Return as needed, schedule a Welcome to Medicare Visit for next visit within the first year of getting Medicare part B. +

## 2023-02-26 NOTE — Assessment & Plan Note (Signed)
 Continue vit D 4000 units daily

## 2023-02-26 NOTE — Progress Notes (Signed)
 Ph: [phone] Fax: [phone]   Patient ID: Kathy Pugh, female    DOB: Aug 02, 1960, 63 y.o.   MRN: [id number]  This visit was conducted in person.  BP 120/66   Pulse 78   Temp 98.1 F (36.7 C) (Oral)   Ht 5' 2.75" (1.594 m)   Wt 142 lb (64.4 kg)   SpO2 94%   BMI 25.36 kg/m    CC: CPE Subjective:   HPI: Kathy Pugh is a 63 y.o. female presenting on 02/26/2023 for Annual Exam   Retired May 2024.  Husband also retired. She has been volunteering at her church.  Training to become [employer].   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    Preventative: COLONOSCOPY Date: 01/2004 normal Jarold Motto).  COLONOSCOPY 04/2021 - SSP, hemorrhoids, rpt 5 yrs Haematologist)  [facility]. Does SBE at home without concerns. Will do CBE QOY with pelvic.  Well woman exam - paps always normal, last 02/2022.  Mom with ?h/o uterine cancer, had abnormal cells that led to hysterectomy at age 52. Given fm hx requested q2 yrs.  Menopause ~2003 at age 52. Off angeliq since 2018. Was on this for hot flashes and vag dryness.  Bone density - discussed cal, vit D in diet, and weight bearing exercise. Good calcium intake. walking somewhat limited by recent knee issues - overall improving.  Lung cancer screen - not eligible  Flu shot at work.  COVID vaccine Pfizer 03/2019, 04/2019, booster 10/2019, bivalent 02/2021, 10/2021 Tdap 11/2012 - update today  RSV - discussed  Shingrix - 11/2018, 05/2019 Advanced directive discussion - has this at home. Husband Kathy Pugh then daughter Kathy Pugh would be HCPOA. Asked to bring [place] a copy.  Seat belt use discussed  Sunscreen use discussed. No suspicious moles on skin.  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 Yogurt for breakfast, 1 cup milk/day      Relevant past medical, surgical, family and social history reviewed and updated as indicated. Interim medical history since our last visit reviewed. Allergies and medications reviewed and updated. Outpatient Medications Prior to Visit  Medication Sig Dispense Refill   cetirizine (ZYRTEC) 10 MG tablet Take 10 mg by mouth daily.     Cholecalciferol (VITAMIN D3) 50 MCG (2000 UT) TABS Take 2 tablets (4,000 Units total) by mouth daily.     COD LIVER OIL PO Take 1 capsule by mouth daily.     Cyanocobalamin (VITAMIN B-12) 1000 MCG SUBL Place 1 tablet (1,000 mcg total) under the tongue daily. 1500 mcg  0   folic acid (FOLVITE) 1 MG tablet Take 1 mg by mouth daily.     naproxen (NAPROSYN) 500 MG tablet Take 1 tablet (500 mg total) by mouth 2 (two) times daily with a meal. 60 tablet 1   VITAMIN A PO Take by mouth daily.     vitamin C (ASCORBIC ACID) 500 MG tablet Take 500 mg by mouth daily.     vitamin E 180 MG (  400 UNITS) capsule Take 400 Units by mouth daily.     No facility-administered medications prior to visit.     Per HPI unless specifically indicated in ROS section below Review of Systems  Constitutional:  Negative for activity change, appetite change, chills, fatigue, fever and unexpected weight change.  HENT:  Negative for hearing loss.   Eyes:  Negative for visual disturbance.  Respiratory:  Negative for cough, chest tightness, shortness of breath and wheezing.   Cardiovascular:  Negative for chest pain, palpitations and leg swelling.  Gastrointestinal:  Negative for abdominal distention, abdominal pain, blood in stool, constipation, diarrhea, nausea and vomiting.  Genitourinary:  Negative for difficulty urinating and hematuria.   Musculoskeletal:  Negative for arthralgias, myalgias and neck pain.  Skin:  Negative for rash.  Neurological:  Positive for headaches (occ sinus pressure). Negative for dizziness, seizures and syncope.  Hematological:  Negative for adenopathy. Does not bruise/bleed easily.  Psychiatric/Behavioral:  Negative for dysphoric mood. The patient is not nervous/anxious.     Objective:  BP 120/66   Pulse 78   Temp 98.1 F (36.7 C) (Oral)   Ht 5' 2.75" (1.594 m)   Wt 142 lb (64.4 kg)   SpO2 94%   BMI 25.36 kg/m   Wt Readings from Last 3 Encounters:  02/26/23 142 lb (64.4 kg)  02/16/22 139 lb (63 kg)  02/12/22 143 lb 6 oz (65 kg)      Physical Exam Vitals and nursing note reviewed.  Constitutional:      Appearance: Normal appearance. She is not ill-appearing.  HENT:     Head: Normocephalic and atraumatic.     Right Ear: Tympanic membrane, ear canal and external ear normal. There is no impacted cerumen.     Left Ear: Tympanic membrane, ear canal and external ear normal. There is no impacted cerumen.     Mouth/Throat:     Mouth: Mucous membranes are moist.     Pharynx: Oropharynx is clear. No oropharyngeal exudate or posterior oropharyngeal erythema.  Eyes:     General:        Right eye: No discharge.        Left eye: No discharge.     Extraocular Movements: Extraocular movements intact.     Conjunctiva/sclera: Conjunctivae normal.     Pupils: Pupils are equal, round, and reactive to light.  Neck:     Thyroid: No thyroid mass or thyromegaly.     Vascular: No carotid bruit.  Cardiovascular:     Rate and Rhythm: Normal rate and regular rhythm.     Pulses: Normal pulses.     Heart sounds: Normal heart sounds. No murmur heard. Pulmonary:     Effort: Pulmonary effort is normal. No respiratory distress.     Breath sounds: Normal breath sounds. No wheezing, rhonchi or rales.  Abdominal:     General: Bowel sounds are normal. There is no distension.     Palpations: Abdomen is soft.  There is no mass.     Tenderness: There is no abdominal tenderness. There is no guarding or rebound.     Hernia: No hernia is present.  Musculoskeletal:     Cervical back: Normal range of motion and neck supple. No rigidity.     Right lower leg: No edema.     Left lower leg: No edema.  Lymphadenopathy:     Cervical: No cervical adenopathy.  Skin:    General: Skin is warm and dry.     Findings: No rash.  Neurological:     General: No focal deficit present.     Mental Status: She is alert. Mental status is at baseline.  Psychiatric:        Mood and Affect: Mood normal.        Behavior: Behavior normal.       Results for orders placed or performed in visit on 02/11/23  TSH   Collection Time: 02/11/23  7:49 AM  Result Value Ref Range   TSH 1.64 0.35 - 5.50 uIU/mL  VITAMIN D 25 Hydroxy (Vit-D Deficiency, Fractures)   Collection Time: 02/11/23  7:49 AM  Result Value Ref Range   VITD 35.03 30.00 - 100.00 ng/mL  Comprehensive metabolic panel   Collection Time: 02/11/23  7:49 AM  Result Value Ref Range   Sodium 142 135 - 145 mEq/L   Potassium 4.5 3.5 - 5.1 mEq/L   Chloride 106 96 - 112 mEq/L   CO2 27 19 - 32 mEq/L   Glucose, Bld 88 70 - 99 mg/dL   BUN 13 6 - 23 mg/dL   Creatinine, Ser 4.09 0.40 - 1.20 mg/dL   Total Bilirubin 0.5 0.2 - 1.2 mg/dL   Alkaline Phosphatase 71 39 - 117 U/L   AST 20 0 - 37 U/L   ALT 17 0 - 35 U/L   Total Protein 6.8 6.0 - 8.3 g/dL   Albumin 4.5 3.5 - 5.2 g/dL   GFR 81.19 >14.78 mL/min   Calcium 9.4 8.4 - 10.5 mg/dL  Lipid panel   Collection Time: 02/11/23  7:49 AM  Result Value Ref Range   Cholesterol 174 0 - 200 mg/dL   Triglycerides 29.5 0.0 - 149.0 mg/dL   HDL 62.13 >08.65 mg/dL   VLDL 78.4 0.0 - 69.6 mg/dL   LDL Cholesterol 95 0 - 99 mg/dL   Total CHOL/HDL Ratio 3    NonHDL 114.25     Assessment & Plan:   Problem List Items Addressed This Visit     Healthcare maintenance - Primary (Chronic)   Preventative protocols reviewed and  updated unless pt declined. Discussed healthy diet and lifestyle.  Will order DEXA to be done at upcoming mammogram summer 2025      Advanced directives, counseling/discussion (Chronic)   Previously discussed, asked to bring [place] copy      Vitamin D deficiency   Continue vit D 4000 units daily       Hyperlipidemia   Remote h/o elevated readings. Normal readings over the last several years. Consider dropping screening frequency. The 10-year ASCVD risk score (Arnett DK, et al., 2019) is: 3.9%   Values used to calculate the score:     Age: 22 years     Sex: Female     Is Non-Hispanic African American: No     Diabetic: No     Tobacco smoker: No     Systolic Blood Pressure: 120 mmHg     Is BP treated: No     HDL Cholesterol: 59.9 mg/dL     Total Cholesterol: 174 mg/dL       Other Visit Diagnoses       Estrogen deficiency       Relevant Orders   DG Bone Density     Need for Tdap vaccination       Relevant Orders   Tdap vaccine greater than or equal to 7yo IM (Completed)        No orders of the defined types were placed in this encounter.   Orders Placed  This Encounter  Procedures   DG Bone Density    Standing Status:   Future    Expiration Date:   02/26/2024    Reason for Exam (SYMPTOM  OR DIAGNOSIS REQUIRED):   osteoporosis screening    [facility]   Tdap vaccine greater than or equal to 7yo IM    Patient Instructions  Tdap today  When you schedule your mammogram for this summer, ask to also schedule bone density scan.  Bring [place] copy of your advanced directive.  Good to see you today Return as needed, schedule a Welcome to Medicare Visit for next visit within the first year of getting Medicare part B. +  Follow up plan: Return in about 1 year (around 02/26/2024), or if symptoms worsen or fail to improve, for medicare wellness visit.  Eustaquio Boyden, MD

## 2023-02-26 NOTE — Assessment & Plan Note (Signed)
 Remote h/o elevated readings. Normal readings over the last several years. Consider dropping screening frequency. The 10-year ASCVD risk score (Arnett DK, et al., 2019) is: 3.9%   Values used to calculate the score:     Age: 63 years     Sex: Female     Is Non-Hispanic African American: No     Diabetic: No     Tobacco smoker: No     Systolic Blood Pressure: 120 mmHg     Is BP treated: No     HDL Cholesterol: 59.9 mg/dL     Total Cholesterol: 174 mg/dL

## 2023-05-24 ENCOUNTER — Other Ambulatory Visit: Payer: Self-pay | Admitting: Family Medicine

## 2023-05-24 DIAGNOSIS — Z1231 Encounter for screening mammogram for malignant neoplasm of breast: Secondary | ICD-10-CM

## 2023-06-03 DIAGNOSIS — H524 Presbyopia: Secondary | ICD-10-CM | POA: Diagnosis not present

## 2023-07-11 ENCOUNTER — Ambulatory Visit
Admission: RE | Admit: 2023-07-11 | Discharge: 2023-07-11 | Disposition: A | Discharge status: Home / Self Care | Source: Ambulatory Visit | Attending: Family Medicine | Admitting: Family Medicine

## 2023-07-11 DIAGNOSIS — Z1231 Encounter for screening mammogram for malignant neoplasm of breast: Secondary | ICD-10-CM

## 2023-07-16 ENCOUNTER — Ambulatory Visit: Payer: Self-pay | Admitting: Family Medicine

## 2023-10-25 ENCOUNTER — Other Ambulatory Visit: Payer: 59

## 2023-11-25 ENCOUNTER — Ambulatory Visit (HOSPITAL_BASED_OUTPATIENT_CLINIC_OR_DEPARTMENT_OTHER)
Admission: RE | Admit: 2023-11-25 | Discharge: 2023-11-25 | Disposition: A | Discharge status: Home / Self Care | Source: Ambulatory Visit | Attending: Family Medicine | Admitting: Family Medicine

## 2023-11-25 DIAGNOSIS — Z780 Asymptomatic menopausal state: Secondary | ICD-10-CM | POA: Diagnosis not present

## 2023-11-25 DIAGNOSIS — M8589 Other specified disorders of bone density and structure, multiple sites: Secondary | ICD-10-CM | POA: Diagnosis not present

## 2023-11-25 DIAGNOSIS — E2839 Other primary ovarian failure: Secondary | ICD-10-CM | POA: Diagnosis not present

## 2023-11-30 ENCOUNTER — Ambulatory Visit: Payer: Self-pay | Admitting: Family Medicine

## 2023-11-30 DIAGNOSIS — M8580 Other specified disorders of bone density and structure, unspecified site: Secondary | ICD-10-CM | POA: Insufficient documentation

## 2023-11-30 DIAGNOSIS — M85851 Other specified disorders of bone density and structure, right thigh: Secondary | ICD-10-CM

## 2024-01-07 ENCOUNTER — Encounter: Payer: Self-pay | Admitting: Family Medicine

## 2024-01-07 ENCOUNTER — Ambulatory Visit (INDEPENDENT_AMBULATORY_CARE_PROVIDER_SITE_OTHER): Admitting: Family Medicine

## 2024-01-07 VITALS — BP 90/60 | HR 79 | Temp 98.4°F | Resp 14 | Ht 63.0 in | Wt 144.0 lb

## 2024-01-07 DIAGNOSIS — H9202 Otalgia, left ear: Secondary | ICD-10-CM | POA: Insufficient documentation

## 2024-01-07 DIAGNOSIS — R0981 Nasal congestion: Secondary | ICD-10-CM | POA: Insufficient documentation

## 2024-01-07 MED ORDER — FLUTICASONE PROPIONATE 50 MCG/ACT NA SUSP: 2.0000 | Freq: Every day | NASAL | 1 refills | Status: DC

## 2024-01-07 NOTE — Patient Instructions (Signed)
 For congestion- use flonase  nasal spray once daily as directed -to open up nasal passages and ears and sinuses   Get debrox ear solution or similar product over the counter  Use as directed every other day Follow up in 1-2 weeks so we can re evaluate and likely flush your ear   In the meantime-if symptoms worsen or new symptoms, please call

## 2024-01-07 NOTE — Progress Notes (Signed)
 "  Subjective:    Patient ID: LORRIANN HANSMANN, female    DOB: July 08, 1960, 64 y.o.   MRN: [id number]  HPI  Wt Readings from Last 3 Encounters:  01/07/24 144 lb (65.3 kg)  02/26/23 142 lb (64.4 kg)  02/16/22 139 lb (63 kg)   25.51 kg/m  Vitals:   01/07/24 0929  BP: 90/60  Pulse: 79  Resp: 14  Temp: 98.4 F (36.9 C)  SpO2: 98%    64 yo pt of Dr KANDICE presents with ear pain   Discomfort in left ear  Tenderness to the touch / almost a numb feeling  Started Sunday= worse now  No drainage  Some decrease in hearing on that side Some mild nasal congestion - not getting anything out  No fever  No swelling in face  No sinus pain   No recent swimming or water in ear   Hot shower this am did not help   Not prone to ear infections   Allergies-takes generic zyrtec daily  No nasal sprays    Patient Active Problem List   Diagnosis Date Noted   Left ear pain 01/07/2024   Nasal congestion 01/07/2024   Osteopenia 11/30/2023   Advanced directives, counseling/discussion 02/16/2022   Chronic pain of left knee 07/26/2021   Cerumen impaction 12/15/2019   Postmenopausal 01/06/2015   Hyperlipidemia 11/30/2013   Vertigo 07/29/2013   Vitamin D  deficiency 08/20/2010   Healthcare maintenance 08/16/2010   Seasonal allergies    Recurrent UTI    Past Medical History:  Diagnosis Date   GERD (gastroesophageal reflux disease) 2005   with esophageal stricture and HH per EGD   Headache(784.0)    frequent, takes tylenol   History of chicken pox    Hx: UTI (urinary tract infection)    trimethoprim  ppx   Irritable bowel syndrome with diarrhea 2005   per GI, Dr. Jakie   Migraines    none in years   Seasonal allergies    Vitamin D  deficiency    Past Surgical History:  Procedure Laterality Date   BREAST BIOPSY Left    COLONOSCOPY  01/2004   normal [facility])   COLONOSCOPY  04/2021   SSP, hemorrhoids, rpt 5 yrs ([facility])   ESOPHAGOGASTRODUODENOSCOPY  10/2003   GERD, HH, esoph  stricture, neg H pylori   HAMMER TOE SURGERY  11/2009   left foot - hammer toe and bunion   laparoscopic uterine nerve ablation  1998   for endometriosis   TONSILLECTOMY AND ADENOIDECTOMY  1983   TUBAL LIGATION     Social History[1] Family History  Problem Relation Age of Onset   Hyperlipidemia Mother 71       hysterectomy for abnormal cells   Heart Problems Mother 69       cardiac myxoma   Uterine cancer Mother    Diabetes Father    Coronary artery disease Father 65   Hypertension Sister    Diabetes Sister    Stroke Maternal Grandfather    Breast cancer Neg Hx    Colon cancer Neg Hx    Colon polyps Neg Hx    Esophageal cancer Neg Hx    Stomach cancer Neg Hx    Rectal cancer Neg Hx    BRCA 1/2 Neg Hx    Allergies[2] Medications Ordered Prior to Encounter[3]  Review of Systems  Constitutional:  Negative for activity change, appetite change, fatigue, fever and unexpected weight change.  HENT:  Positive for congestion, ear pain and hearing loss. Negative  for dental problem, ear discharge, facial swelling, nosebleeds, postnasal drip, rhinorrhea, sinus pressure, sinus pain, sneezing, sore throat, tinnitus, trouble swallowing and voice change.   Eyes:  Negative for pain, redness and visual disturbance.  Respiratory:  Negative for cough, shortness of breath and wheezing.   Cardiovascular:  Negative for chest pain and palpitations.  Gastrointestinal:  Negative for abdominal pain, blood in stool, constipation and diarrhea.  Endocrine: Negative for polydipsia and polyuria.  Genitourinary:  Negative for dysuria, frequency and urgency.  Musculoskeletal:  Negative for arthralgias, back pain and myalgias.  Skin:  Negative for pallor and rash.  Allergic/Immunologic: Negative for environmental allergies.  Neurological:  Negative for dizziness, syncope and headaches.  Hematological:  Negative for adenopathy. Does not bruise/bleed easily.  Psychiatric/Behavioral:  Negative for decreased  concentration and dysphoric mood. The patient is not nervous/anxious.        Objective:   Physical Exam Constitutional:      General: She is not in acute distress.    Appearance: Normal appearance. She is well-developed and normal weight. She is not ill-appearing or diaphoretic.  HENT:     Head: Normocephalic and atraumatic.     Comments: No facial swelling or tenderness No temporal or TM joint tenderness     Right Ear: Tympanic membrane, ear canal and external ear normal.     Left Ear: External ear normal. There is impacted cerumen.     Ears:     Comments: Right ear canal-small amt of cerumen at entrance , can visualize TM well  Left ear canal- dry /impacted cerumen TM not visualized  No swelling of external ear /canal No drainage Mildly tender to press on tragus       Nose: Congestion present.     Mouth/Throat:     Mouth: Mucous membranes are moist.     Pharynx: Oropharynx is clear. No oropharyngeal exudate or posterior oropharyngeal erythema.  Eyes:     Conjunctiva/sclera: Conjunctivae normal.     Pupils: Pupils are equal, round, and reactive to light.  Neck:     Thyroid : No thyromegaly.     Vascular: No carotid bruit or JVD.  Cardiovascular:     Rate and Rhythm: Normal rate and regular rhythm.     Heart sounds: Normal heart sounds.     No gallop.  Pulmonary:     Effort: Pulmonary effort is normal. No respiratory distress.     Breath sounds: Normal breath sounds. No wheezing or rales.  Abdominal:     General: There is no distension or abdominal bruit.     Palpations: Abdomen is soft.  Musculoskeletal:     Cervical back: Normal range of motion and neck supple.     Right lower leg: No edema.     Left lower leg: No edema.  Lymphadenopathy:     Cervical: No cervical adenopathy.  Skin:    General: Skin is warm and dry.     Coloration: Skin is not pale.     Findings: No erythema, lesion or rash.  Neurological:     Mental Status: She is alert.     Coordination:  Coordination normal.     Deep Tendon Reflexes: Reflexes are normal and symmetric. Reflexes normal.  Psychiatric:        Mood and Affect: Mood normal.           Assessment & Plan:   Problem List Items Addressed This Visit       Other   Nasal congestion   Mild  Unable to get much mucous out of nose No st or fever   Takes zyrtec daily for seasonal allergies   Will try flonase  2 sp /each nostril daily  Aware will take some time to work   Follow up for re check /also check ear in 1-2 wk (suspect ETD also)  Update if not starting to improve in a week or if worsening  Call back and Er precautions noted in detail today        Left ear pain - Primary   Some discomfort/pressure- and mild tenderness, also reduced hearing  In conjunction with mild nasal congestion but no fever  Reassuring exam- cerumen impaction noted (dry) with otherwise normal appearing canal/no skin changes or facila swelling   Suspect combo of ETD and cerumen impaction  Will try flonase  for ETD  Debrox for cerumen and follow up for irrigation and re check   Cannot fully r/o bacterial infection due to cerumen so instructed to call asap if more pain or any fever or new symptoms  Call back and Er precautions noted in detail today           [1]  Social History Tobacco Use   Smoking status: Former    Current packs/day: 0.00    Types: Cigarettes    Quit date: 03/02/1983    Years since quitting: 40.8    Passive exposure: Past   Smokeless tobacco: Never  Vaping Use   Vaping status: Never Used  Substance Use Topics   Alcohol use: No   Drug use: No  [2]  Allergies Allergen Reactions   Tramadol  Nausea Only    Nausea, shaking   Vicodin [Hydrocodone-Acetaminophen] Nausea Only  [3]  Current Outpatient Medications on File Prior to Visit  Medication Sig Dispense Refill   cetirizine (ZYRTEC) 10 MG tablet Take 10 mg by mouth daily.     Cholecalciferol (VITAMIN D3) 50 MCG (2000 UT) TABS Take 2 tablets  (4,000 Units total) by mouth daily.     COD LIVER OIL PO Take 1 capsule by mouth daily.     Cyanocobalamin (VITAMIN B-12) 1000 MCG SUBL Place 1 tablet (1,000 mcg total) under the tongue daily. 1500 mcg  0   folic acid (FOLVITE) 1 MG tablet Take 1 mg by mouth daily.     VITAMIN A PO Take by mouth daily.     vitamin C (ASCORBIC ACID) 500 MG tablet Take 500 mg by mouth daily.     vitamin E 180 MG (400 UNITS) capsule Take 400 Units by mouth daily.     No current facility-administered medications on file prior to visit.   "

## 2024-01-07 NOTE — Assessment & Plan Note (Signed)
 Mild  Unable to get much mucous out of nose No st or fever   Takes zyrtec daily for seasonal allergies   Will try flonase  2 sp /each nostril daily  Aware will take some time to work   Follow up for re check /also check ear in 1-2 wk (suspect ETD also)  Update if not starting to improve in a week or if worsening  Call back and Er precautions noted in detail today

## 2024-01-07 NOTE — Assessment & Plan Note (Signed)
 Some discomfort/pressure- and mild tenderness, also reduced hearing  In conjunction with mild nasal congestion but no fever  Reassuring exam- cerumen impaction noted (dry) with otherwise normal appearing canal/no skin changes or facila swelling   Suspect combo of ETD and cerumen impaction  Will try flonase  for ETD  Debrox for cerumen and follow up for irrigation and re check   Cannot fully r/o bacterial infection due to cerumen so instructed to call asap if more pain or any fever or new symptoms  Call back and Er precautions noted in detail today

## 2024-01-20 ENCOUNTER — Encounter: Payer: Self-pay | Admitting: Family Medicine

## 2024-01-20 ENCOUNTER — Ambulatory Visit: Admitting: Family Medicine

## 2024-01-20 VITALS — BP 98/60 | HR 80 | Temp 98.2°F | Ht 63.0 in | Wt 144.4 lb

## 2024-01-20 DIAGNOSIS — J302 Other seasonal allergic rhinitis: Secondary | ICD-10-CM

## 2024-01-20 DIAGNOSIS — J019 Acute sinusitis, unspecified: Secondary | ICD-10-CM | POA: Diagnosis not present

## 2024-01-20 DIAGNOSIS — H6122 Impacted cerumen, left ear: Secondary | ICD-10-CM | POA: Diagnosis not present

## 2024-01-20 DIAGNOSIS — H9202 Otalgia, left ear: Secondary | ICD-10-CM | POA: Diagnosis not present

## 2024-01-20 NOTE — Assessment & Plan Note (Addendum)
 Anticipate she developed viral sinusitis last week that is now resolved.  Anticipate viral given short duration and resolution without antibiotic use.  She will update us  if new or worsening symptoms.

## 2024-01-20 NOTE — Assessment & Plan Note (Addendum)
 This is resolved. Anticipate ETD + viral sinusitis that is now improved.  She will continue flonase , debrox use.

## 2024-01-20 NOTE — Assessment & Plan Note (Signed)
 Persistent, but she declines irrigation today as symptoms are largely improved.  She will continue home treatment with debrox ear drops.

## 2024-01-20 NOTE — Patient Instructions (Addendum)
 As symptoms are improving, continue flonase  and debrox as up to now We will recheck next month at physical.

## 2024-01-20 NOTE — Progress Notes (Signed)
 " Ph: [phone] Fax: [phone]   Patient ID: Kathy Pugh, female    DOB: 1960/05/29, 64 y.o.   MRN: [id number]  This visit was conducted in person.  BP 98/60 (BP Location: Right Arm, Patient Position: Sitting, Cuff Size: Normal)   Pulse 80   Temp 98.2 F (36.8 C) (Oral)   Ht 5' 3 (1.6 m)   Wt 144 lb 6.4 oz (65.5 kg)   SpO2 97%   BMI 25.58 kg/m   BP Readings from Last 3 Encounters:  01/20/24 98/60  01/07/24 90/60  02/26/23 120/66    CC: check ear wax  Subjective:   HPI: Kathy Pugh is a 64 y.o. female presenting on 01/20/2024 for Medical Management of Chronic Issues (FU from 1/6 visit/Pt states ear is completely better now, states she was sick as a dog last week on 1/9 with sinus pressure/ congestion but is now over that as well/ No complaints today)   She did the [place] this past year!   Saw Dr Randeen earlier this month with L ear pain, decreased hearing and dry cerumen impaction - treated with regular flonase  use and debrox to ear with planned f/u in 1-2 wks.   Here for recheck - she has been using debrox ear drops every night. No hearing changes. She continues flonase  with benefit.   She notes acute illness last week with significant head pressure, congestion, sinus drainage - treated at home with mucinex  with benefit.   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 Interim medical history since our last visit reviewed. Allergies and medications reviewed and updated. Outpatient Medications Prior to Visit  Medication Sig Dispense Refill   cetirizine (ZYRTEC) 10 MG tablet Take 10 mg by mouth daily.     Cholecalciferol (VITAMIN D3) 50 MCG (2000 UT) TABS Take 2 tablets (4,000 Units total) by mouth daily.     COD LIVER OIL PO Take 1 capsule by mouth daily.     Cyanocobalamin (VITAMIN B-12) 1000 MCG SUBL Place 1 tablet (1,000 mcg  total) under the tongue daily. 1500 mcg  0   fluticasone  (FLONASE ) 50 MCG/ACT nasal spray Place 2 sprays into both nostrils daily. 16 g 1   folic acid (FOLVITE) 1 MG tablet Take 1 mg by mouth daily.     VITAMIN A PO Take by mouth daily.     vitamin C (ASCORBIC ACID) 500 MG tablet Take 500 mg by mouth daily.     vitamin E 180 MG (400 UNITS) capsule Take 400 Units by mouth daily.     No facility-administered medications prior to visit.     Per HPI unless specifically indicated in ROS section below Review of Systems  Objective:  BP 98/60 (BP Location: Right Arm, Patient Position: Sitting, Cuff Size: Normal)   Pulse 80   Temp 98.2 F (36.8 C) (Oral)   Ht 5' 3 (1.6 m)   Wt 144 lb 6.4 oz (65.5 kg)   SpO2 97%   BMI 25.58 kg/m   Wt Readings from Last 3 Encounters:  01/20/24 144 lb 6.4 oz (65.5 kg)  01/07/24 144 lb (65.3 kg)  02/26/23 142 lb (64.4 kg)      Physical Exam Vitals and nursing note reviewed.  Constitutional:      Appearance: Normal appearance. She is not ill-appearing.  HENT:     Head: Normocephalic  and atraumatic.     Right Ear: Hearing, tympanic membrane, ear canal and external ear normal. No decreased hearing noted. There is no impacted cerumen.     Left Ear: Hearing, tympanic membrane, ear canal and external ear normal. No decreased hearing noted. There is impacted cerumen.     Nose: Mucosal edema present. No congestion or rhinorrhea.     Right Turbinates: Swollen. Not pale.     Left Turbinates: Swollen. Not pale.     Right Sinus: No maxillary sinus tenderness.     Left Sinus: No maxillary sinus tenderness or frontal sinus tenderness.     Comments:  Possible L sided nasal polyp     Mouth/Throat:     Mouth: Mucous membranes are moist.     Pharynx: Oropharynx is clear. No oropharyngeal exudate or posterior oropharyngeal erythema.     Tonsils: No tonsillar exudate.  Eyes:     Extraocular Movements: Extraocular movements intact.     Conjunctiva/sclera:  Conjunctivae normal.     Pupils: Pupils are equal, round, and reactive to light.  Lymphadenopathy:     Head:     Right side of head: No submental, submandibular, tonsillar, preauricular or posterior auricular adenopathy.     Left side of head: No submental, submandibular, tonsillar, preauricular or posterior auricular adenopathy.     Cervical: No cervical adenopathy.     Right cervical: No superficial cervical adenopathy.    Left cervical: No superficial cervical adenopathy.     Upper Body:     Right upper body: No supraclavicular adenopathy.     Left upper body: No supraclavicular adenopathy.  Skin:    General: Skin is warm and dry.     Findings: No rash.  Neurological:     Mental Status: She is alert.  Psychiatric:        Mood and Affect: Mood normal.        Behavior: Behavior normal.       Results for orders placed or performed in visit on 02/11/23  TSH   Collection Time: 02/11/23  7:49 AM  Result Value Ref Range   TSH 1.64 0.35 - 5.50 uIU/mL  VITAMIN D  25 Hydroxy (Vit-D Deficiency, Fractures)   Collection Time: 02/11/23  7:49 AM  Result Value Ref Range   VITD 35.03 30.00 - 100.00 ng/mL  Comprehensive metabolic panel   Collection Time: 02/11/23  7:49 AM  Result Value Ref Range   Sodium 142 135 - 145 mEq/L   Potassium 4.5 3.5 - 5.1 mEq/L   Chloride 106 96 - 112 mEq/L   CO2 27 19 - 32 mEq/L   Glucose, Bld 88 70 - 99 mg/dL   BUN 13 6 - 23 mg/dL   Creatinine, Ser 9.19 0.40 - 1.20 mg/dL   Total Bilirubin 0.5 0.2 - 1.2 mg/dL   Alkaline Phosphatase 71 39 - 117 U/L   AST 20 0 - 37 U/L   ALT 17 0 - 35 U/L   Total Protein 6.8 6.0 - 8.3 g/dL   Albumin 4.5 3.5 - 5.2 g/dL   GFR 22.31 >39.99 mL/min   Calcium 9.4 8.4 - 10.5 mg/dL  Lipid panel   Collection Time: 02/11/23  7:49 AM  Result Value Ref Range   Cholesterol 174 0 - 200 mg/dL   Triglycerides 05.9 0.0 - 149.0 mg/dL   HDL 40.09 >60.99 mg/dL   VLDL 81.1 0.0 - 59.9 mg/dL   LDL Cholesterol 95 0 - 99 mg/dL   Total  CHOL/HDL Ratio 3  NonHDL 114.25     Assessment & Plan:   Problem List Items Addressed This Visit     Seasonal allergies   Continue zyrtec + flonase        Cerumen impaction - Primary   Persistent, but she declines irrigation today as symptoms are largely improved.  She will continue home treatment with debrox ear drops.       Left ear pain   This is resolved. Anticipate ETD + viral sinusitis that is now improved.  She will continue flonase , debrox use.       Acute sinusitis   Anticipate she developed viral sinusitis last week that is now resolved.  Anticipate viral given short duration and resolution without antibiotic use.  She will update us  if new or worsening symptoms.         No orders of the defined types were placed in this encounter.   No orders of the defined types were placed in this encounter.   Patient Instructions  As symptoms are improving, continue flonase  and debrox as up to now We will recheck next month at physical.   Follow up plan: No follow-ups on file.  Anton Blas, MD   "

## 2024-01-20 NOTE — Assessment & Plan Note (Signed)
 Continue zyrtec + flonase 

## 2024-01-31 ENCOUNTER — Other Ambulatory Visit: Payer: Self-pay | Admitting: Family Medicine

## 2024-02-21 ENCOUNTER — Other Ambulatory Visit: Payer: 59

## 2024-02-28 ENCOUNTER — Encounter: Payer: 59 | Admitting: Family Medicine
